# Patient Record
Sex: Female | Born: 1975 | Race: White | Hispanic: No | Marital: Married | Smoking: Former smoker
Health system: Southern US, Community
[De-identification: ages and names within clinical notes are randomized; demographics above are authoritative.]

## PROBLEM LIST (undated history)

## (undated) ENCOUNTER — Inpatient Hospital Stay (HOSPITAL_COMMUNITY): Payer: Self-pay

## (undated) DIAGNOSIS — R87629 Unspecified abnormal cytological findings in specimens from vagina: Secondary | ICD-10-CM

## (undated) DIAGNOSIS — Z973 Presence of spectacles and contact lenses: Secondary | ICD-10-CM

## (undated) DIAGNOSIS — R519 Headache, unspecified: Secondary | ICD-10-CM

## (undated) DIAGNOSIS — D649 Anemia, unspecified: Secondary | ICD-10-CM

## (undated) DIAGNOSIS — O09529 Supervision of elderly multigravida, unspecified trimester: Secondary | ICD-10-CM

## (undated) DIAGNOSIS — R51 Headache: Secondary | ICD-10-CM

## (undated) DIAGNOSIS — Z8742 Personal history of other diseases of the female genital tract: Secondary | ICD-10-CM

## (undated) DIAGNOSIS — Z8619 Personal history of other infectious and parasitic diseases: Secondary | ICD-10-CM

## (undated) HISTORY — DX: Supervision of elderly multigravida, unspecified trimester: O09.529

## (undated) HISTORY — DX: Headache: R51

## (undated) HISTORY — DX: Unspecified abnormal cytological findings in specimens from vagina: R87.629

## (undated) HISTORY — DX: Personal history of other infectious and parasitic diseases: Z86.19

## (undated) HISTORY — DX: Headache, unspecified: R51.9

## (undated) HISTORY — PX: DILATION AND CURETTAGE OF UTERUS: SHX78

---

## 1998-04-22 ENCOUNTER — Emergency Department (HOSPITAL_COMMUNITY): Admission: EM | Admit: 1998-04-22 | Discharge: 1998-04-22 | Payer: Self-pay | Admitting: Emergency Medicine

## 2000-10-06 ENCOUNTER — Encounter: Payer: Self-pay | Admitting: Obstetrics

## 2000-10-06 ENCOUNTER — Inpatient Hospital Stay (HOSPITAL_COMMUNITY): Admission: AD | Admit: 2000-10-06 | Discharge: 2000-10-06 | Payer: Self-pay | Admitting: Obstetrics

## 2000-10-15 ENCOUNTER — Inpatient Hospital Stay (HOSPITAL_COMMUNITY): Admission: AD | Admit: 2000-10-15 | Discharge: 2000-10-21 | Payer: Self-pay | Admitting: *Deleted

## 2000-10-20 ENCOUNTER — Encounter: Payer: Self-pay | Admitting: *Deleted

## 2000-10-20 ENCOUNTER — Encounter: Payer: Self-pay | Admitting: Obstetrics & Gynecology

## 2000-12-10 ENCOUNTER — Encounter (HOSPITAL_COMMUNITY): Admission: RE | Admit: 2000-12-10 | Discharge: 2001-01-09 | Payer: Self-pay | Admitting: *Deleted

## 2001-01-17 ENCOUNTER — Other Ambulatory Visit: Admission: RE | Admit: 2001-01-17 | Discharge: 2001-01-17 | Payer: Self-pay | Admitting: Obstetrics and Gynecology

## 2001-03-26 HISTORY — PX: FOOT SURGERY: SHX648

## 2003-01-29 ENCOUNTER — Other Ambulatory Visit: Admission: RE | Admit: 2003-01-29 | Discharge: 2003-01-29 | Payer: Self-pay | Admitting: Obstetrics and Gynecology

## 2003-02-07 ENCOUNTER — Ambulatory Visit (HOSPITAL_COMMUNITY): Admission: AD | Admit: 2003-02-07 | Discharge: 2003-02-07 | Payer: Self-pay | Admitting: Obstetrics and Gynecology

## 2003-03-15 ENCOUNTER — Emergency Department (HOSPITAL_COMMUNITY): Admission: EM | Admit: 2003-03-15 | Discharge: 2003-03-15 | Payer: Self-pay | Admitting: Emergency Medicine

## 2003-03-29 ENCOUNTER — Emergency Department (HOSPITAL_COMMUNITY): Admission: AD | Admit: 2003-03-29 | Discharge: 2003-03-29 | Payer: Self-pay | Admitting: Family Medicine

## 2004-06-18 ENCOUNTER — Emergency Department (HOSPITAL_COMMUNITY): Admission: EM | Admit: 2004-06-18 | Discharge: 2004-06-18 | Payer: Self-pay | Admitting: Emergency Medicine

## 2006-10-09 ENCOUNTER — Other Ambulatory Visit: Admission: RE | Admit: 2006-10-09 | Discharge: 2006-10-09 | Payer: Self-pay | Admitting: Family Medicine

## 2007-12-26 ENCOUNTER — Ambulatory Visit (HOSPITAL_COMMUNITY): Admission: RE | Admit: 2007-12-26 | Discharge: 2007-12-26 | Payer: Self-pay | Admitting: Obstetrics and Gynecology

## 2008-07-29 ENCOUNTER — Ambulatory Visit: Payer: Self-pay | Admitting: Advanced Practice Midwife

## 2008-07-29 ENCOUNTER — Inpatient Hospital Stay (HOSPITAL_COMMUNITY): Admission: AD | Admit: 2008-07-29 | Discharge: 2008-07-29 | Payer: Self-pay | Admitting: Obstetrics & Gynecology

## 2008-08-13 ENCOUNTER — Inpatient Hospital Stay (HOSPITAL_COMMUNITY): Admission: RE | Admit: 2008-08-13 | Discharge: 2008-08-15 | Payer: Self-pay | Admitting: Obstetrics and Gynecology

## 2009-05-09 ENCOUNTER — Ambulatory Visit (HOSPITAL_COMMUNITY): Payer: Self-pay | Admitting: Psychology

## 2009-06-07 ENCOUNTER — Ambulatory Visit (HOSPITAL_COMMUNITY): Payer: Self-pay | Admitting: Psychology

## 2009-07-05 ENCOUNTER — Ambulatory Visit (HOSPITAL_COMMUNITY): Payer: Self-pay | Admitting: Psychology

## 2010-04-15 ENCOUNTER — Encounter: Payer: Self-pay | Admitting: Internal Medicine

## 2010-07-04 LAB — COMPREHENSIVE METABOLIC PANEL
ALT: 13 U/L (ref 0–35)
BUN: 3 mg/dL — ABNORMAL LOW (ref 6–23)
CO2: 21 mEq/L (ref 19–32)
Calcium: 8.8 mg/dL (ref 8.4–10.5)
Chloride: 109 mEq/L (ref 96–112)
Creatinine, Ser: 0.53 mg/dL (ref 0.4–1.2)
GFR calc Af Amer: >60 mL/min (ref >60)
Potassium: 3.7 mEq/L (ref 3.5–5.1)
Sodium: 137 mEq/L (ref 135–145)
Total Bilirubin: 0.2 mg/dL — ABNORMAL LOW (ref 0.3–1.2)

## 2010-07-04 LAB — CBC
HCT: 23.1 % — ABNORMAL LOW (ref 36.0–46.0)
Hemoglobin: 8.2 g/dL — ABNORMAL LOW (ref 12.0–15.0)
Hemoglobin: 10.6 g/dL — ABNORMAL LOW (ref 12.0–15.0)
MCHC: 35.7 g/dL (ref 30.0–36.0)
MCV: 89.7 fL (ref 78.0–100.0)
Platelets: 109 10*3/uL — ABNORMAL LOW (ref 150–400)
Platelets: 144 10*3/uL — ABNORMAL LOW (ref 150–400)
Platelets: 145 10*3/uL — ABNORMAL LOW (ref 150–400)
RBC: 3.41 MIL/uL — ABNORMAL LOW (ref 3.87–5.11)
RDW: 13.9 % (ref 11.5–15.5)
WBC: 12.8 10*3/uL — ABNORMAL HIGH (ref 4.0–10.5)
WBC: 13.5 10*3/uL — ABNORMAL HIGH (ref 4.0–10.5)

## 2010-07-04 LAB — LACTATE DEHYDROGENASE: LDH: 136 U/L (ref 94–250)

## 2010-12-26 LAB — ABO/RH: ABO/RH(D): AB POS

## 2013-07-08 LAB — OB RESULTS CONSOLE RUBELLA ANTIBODY, IGM: RUBELLA: NON-IMMUNE/NOT IMMUNE

## 2013-07-08 LAB — OB RESULTS CONSOLE RPR: RPR: NONREACTIVE

## 2013-07-08 LAB — OB RESULTS CONSOLE ANTIBODY SCREEN: Antibody Screen: NEGATIVE

## 2013-07-08 LAB — OB RESULTS CONSOLE GC/CHLAMYDIA
CHLAMYDIA, DNA PROBE: NEGATIVE
Gonorrhea: NEGATIVE

## 2013-07-08 LAB — OB RESULTS CONSOLE HEPATITIS B SURFACE ANTIGEN: HEP B S AG: NEGATIVE

## 2013-07-08 LAB — OB RESULTS CONSOLE ABO/RH: RH TYPE: POSITIVE

## 2013-07-08 LAB — OB RESULTS CONSOLE HIV ANTIBODY (ROUTINE TESTING): HIV: NONREACTIVE

## 2013-11-16 ENCOUNTER — Other Ambulatory Visit: Payer: Self-pay | Admitting: Obstetrics and Gynecology

## 2013-11-17 LAB — CYTOLOGY - PAP

## 2014-01-04 ENCOUNTER — Encounter (HOSPITAL_COMMUNITY): Payer: Self-pay | Admitting: *Deleted

## 2014-01-04 ENCOUNTER — Inpatient Hospital Stay (HOSPITAL_COMMUNITY)
Admission: AD | Admit: 2014-01-04 | Discharge: 2014-01-04 | Disposition: A | Discharge status: Home / Self Care | Payer: BC Managed Care – PPO | Source: Ambulatory Visit | Attending: Obstetrics and Gynecology | Admitting: Obstetrics and Gynecology

## 2014-01-04 DIAGNOSIS — Z3A34 34 weeks gestation of pregnancy: Secondary | ICD-10-CM | POA: Diagnosis not present

## 2014-01-04 DIAGNOSIS — S3981XA Other specified injuries of abdomen, initial encounter: Secondary | ICD-10-CM | POA: Diagnosis not present

## 2014-01-04 DIAGNOSIS — W108XXA Fall (on) (from) other stairs and steps, initial encounter: Secondary | ICD-10-CM | POA: Diagnosis not present

## 2014-01-04 DIAGNOSIS — W1839XA Other fall on same level, initial encounter: Secondary | ICD-10-CM

## 2014-01-04 DIAGNOSIS — S80212A Abrasion, left knee, initial encounter: Secondary | ICD-10-CM | POA: Diagnosis not present

## 2014-01-04 DIAGNOSIS — S8980XA Other specified injuries of unspecified lower leg, initial encounter: Secondary | ICD-10-CM | POA: Diagnosis not present

## 2014-01-04 DIAGNOSIS — O9A213 Injury, poisoning and certain other consequences of external causes complicating pregnancy, third trimester: Secondary | ICD-10-CM

## 2014-01-04 DIAGNOSIS — O9989 Other specified diseases and conditions complicating pregnancy, childbirth and the puerperium: Secondary | ICD-10-CM | POA: Diagnosis present

## 2014-01-04 DIAGNOSIS — O26893 Other specified pregnancy related conditions, third trimester: Secondary | ICD-10-CM | POA: Diagnosis not present

## 2014-01-04 DIAGNOSIS — S80211A Abrasion, right knee, initial encounter: Secondary | ICD-10-CM | POA: Diagnosis not present

## 2014-01-04 DIAGNOSIS — Y92481 Parking lot as the place of occurrence of the external cause: Secondary | ICD-10-CM | POA: Insufficient documentation

## 2014-01-04 DIAGNOSIS — Y9389 Activity, other specified: Secondary | ICD-10-CM

## 2014-01-04 NOTE — MAU Provider Note (Signed)
History     CSN: [id number]  Arrival date and time: 01/04/14 1839   None     Chief Complaint  Patient presents with  . Fall   HPI  [name] is a 38 y.o. female [phone] at 61w1dwho presents following a fall that occurred around 1800. She was at work in the parking lot; she was walking and did not noticed a step down. When she fell she landed on her knees; when she realized she fell she was on her abdomen. Her knees took the weight of her fall. +fetal movement. Denies abdominal pain, leaking of fluid, or vaginal bleeding. The patient did not lose consciousness or hit her head; she panicked and that's all she remembers about the fall. She currently denies pain.    RN note: Pt presents with complaints of falling in there parking lot at work. She landed on her knee to break the fall but did hit her abdomen. Denies any abdominal cramping        OB History   Grav Para Term Preterm Abortions TAB SAB Ect Mult Living   5 1 1  0 3  3   1       History reviewed. No pertinent past medical history.  Past Surgical History  Procedure Laterality Date  . Dilation and curettage of uterus      History reviewed. No pertinent family history.  History  Substance Use Topics  . Smoking status: Never Smoker   . Smokeless tobacco: Never Used  . Alcohol Use: No    Allergies: No Known Allergies  Prescriptions prior to admission  Medication Sig Dispense Refill  . cetirizine (ZYRTEC) 10 MG tablet Take 10 mg by mouth daily.      . ferrous sulfate 325 (65 FE) MG tablet Take 325 mg by mouth daily with breakfast.      . Prenatal Vit-Fe Fumarate-FA (PRENATAL MULTIVITAMIN) TABS tablet Take 1 tablet by mouth daily at 12 noon.       No results found for this or any previous visit (from the past 48 hour(s)).   Review of Systems  Constitutional: Negative for fever and chills.  Gastrointestinal: Negative for nausea, vomiting and abdominal pain.  Musculoskeletal: Positive for falls.   Skin:       Scratches on both knees.    Physical Exam   Blood pressure 121/72, pulse 92, temperature 97.8 F (36.6 C), resp. rate 18.  Physical Exam  Constitutional: She appears well-developed and well-nourished. No distress.  HENT:  Head: Normocephalic.  Eyes: Pupils are equal, round, and reactive to light.  Neck: Neck supple.  Cardiovascular: Normal rate and normal heart sounds.   Respiratory: Effort normal and breath sounds normal.  GI: Soft. There is no tenderness.  Musculoskeletal: Normal range of motion.       Right knee: She exhibits normal range of motion, no swelling and no erythema.       Left knee: She exhibits normal range of motion, no swelling and no erythema.  Minor abrasions to bilateral knees   Neurological: She is alert. GCS eye subscore is 4. GCS verbal subscore is 5. GCS motor subscore is 6.  Skin: Skin is warm. She is not diaphoretic.  Psychiatric: Her behavior is normal.   Fetal Tracing: Baseline: 140 Variability: moderate  Accelerations: 15x15 Decelerations: None Toco: None   MAU Course  Procedures None  MDM NST  AB positive blood type  Discussed the patient with Dr. TGaetano Net It is recommeneded that the patient  stay for 4 hours of fetal monitoroing. The patient is present with her husband and is unsure whether or not she can stay for all of the monitoring that is recommended. I discussed with the patient that if she is unable to stay for the monitoring that she will need to sign out of MAU as AMA. Report given to V. Tamala Julian CNM who resumes care of the patient.  Assessment and Plan   Ann Hillock Rasch, NP 01/04/2014 8:07 PM  Care of pt assumed by Manya Silvas, CNM at 8:10 pm. FHR reactive. No contractions. Pt plans to stay for remainder of 4 hours of monitoring recommended by Dr. Gaetano Net.   FHR reactive throughout MAU visit. No UC's. No VB.  ASSESSMENT: 1. Traumatic injury during pregnancy in third trimester    PLAN: D/C  home Abruption precautions. PTL precautions and FKCs Follow-up Information   Follow up with [facility] Marjean Donna, MD. (As scheduled )    Specialty:  Obstetrics and Gynecology   Contact information:   [address] [place] [address] [phone]       Follow up with [facility]. (As needed in emergencies)    Contact information:   [id number] [place] [address] [phone]       Medication List         acetaminophen 325 MG tablet  Commonly known as:  TYLENOL  Take 650 mg by mouth every 6 (six) hours as needed for headache.     cetirizine 10 MG tablet  Commonly known as:  ZYRTEC  Take 10 mg by mouth daily.     ferrous sulfate 325 (65 FE) MG tablet  Take 325 mg by mouth daily.     HYDROcodone-acetaminophen 5-325 MG per tablet  Commonly known as:  NORCO/VICODIN  Take 1 tablet by mouth every 6 (six) hours as needed (for migraines).     prenatal multivitamin Tabs tablet  Take 1 tablet by mouth daily.       [place], CNM 01/04/2014 11:00 PM

## 2014-01-04 NOTE — Discharge Instructions (Signed)
What Do I Need to Know About Injuries During Pregnancy? Injuries can happen during pregnancy. Minor falls and accidents usually do not harm you or your baby. However, any injury should be reported to your doctor. WHAT CAN I DO TO PROTECT MYSELF FROM INJURIES?  Remove rugs and loose objects on the floor.  Wear comfortable shoes that have a good grip. Do not wear high-heeled shoes.  Always wear your seat belt. The lap belt should be below your belly. Always practice safe driving.  Do not ride on a motorcycle.  Do not participate in high-impact activities or sports.  Avoid:  Walking on wet or slippery floors.  Fires.  Starting fires.  Lifting heavy pots of boiling or hot liquids.  Fixing electrical problems.  Only take medicine as told by your doctor.  Know your blood type and the blood type of the baby's father.  Call your local emergency services (911 in the [place] if you are a victim of domestic violence or assault. For help and support, contact the [employer]. WHEN SHOULD I GET HELP RIGHT AWAY?  You fall on your belly or have any high-impact accident or injury.  You have been a victim of domestic violence or any kind of violence.  You have been in a car accident.  You have bleeding from your vagina.  Fluid is leaking from your vagina.  You start to have belly cramping (contractions) or pain.  You feel weak or pass out (faint).  You start to throw up (vomit) after an injury.  You have been burned.  You have a stiff neck or neck pain.  You get a headache or have vision problems after an injury.  You do not feel the baby move or the baby is not moving as much as normal. Document Released: 04/14/2010 Document Revised: 07/27/2013 Document Reviewed: 12/17/2012 [facility] Patient Information 2015 [place], [place]. This information is not intended to replace advice given to you by your health care provider. Make sure you discuss any questions you  have with your health care provider.  Placental Abruption Your placenta is the organ that nourishes your unborn baby (fetus). Your baby gets his or her blood supply and nutrients through your placenta. It is your baby's life support system. It is attached to the inside of your uterus until after your baby is born.  Placental abruption is when the placenta partly or completely separates from the uterus before your baby is born. This is rare, but it can happen any time after 20 weeks of pregnancy. A small separation may not cause problems, but a large separation may be dangerous for you and your baby. CAUSES  Most of the time the cause of a placental abruption is unknown. Though it is rare, a placental abruption can be caused by:   An abdominal injury.   The baby turning from a buttocks-first position (breech presentation) or a sideways position (transverse) to a headfirst position (cephalic).   Delivering the first of multiple babies (twins, triplets, or more).   Sudden loss of amniotic fluid (premature rupture of the membranes).   An abnormally short umbilical cord. RISK FACTORS Some risk factors make a placental abruption more likely, including:  History of placental abruption.  High blood pressure (hypertension).  Smoking.  Alcohol intake.  Blood clotting problems.  Too much amniotic fluid.  Having had multiples (twins or triplets or more).  Seizures and convulsions.  Diabetes mellitus.  Having had more than four children.  Age 37 years  or older.  Illegal drug use.  Injury to your abdomen. SIGNS AND SYMPTOMS  A small placental abruption may not cause symptoms. If you do have symptoms, they may include:  Mild abdominal pain.  Slight vaginal bleeding. Symptoms of severe placental abruption depend on the size of the separation and the stage of pregnancy. Symptoms may include:   Sudden pain in your uterus.  Abdominal pain.  Vaginal bleeding.  Tender  uterus.  Severe abdominal pain with tenderness.  Continual contractions of your uterus.  Back pain.  Weakness, light-headedness. DIAGNOSIS  Placental abruption is suspected when a pregnant woman develops sudden pain in her uterus. The health care provider will check whether the uterus is very tender, hard, and enlarging and whether the baby has an abnormal heart rate or rhythm. Ultrasonography (commonly called an ultrasound) will be done. Blood work will also be done to make sure that there are enough healthy red blood cells and that there are no clotting problems or signs of too much blood loss. TREATMENT  Placental abruption is usually an emergency. It requires treatment right away. Your treatment will depend on:   The amount of bleeding.  Whether you or you baby are in distress.  The stage of your pregnancy.  The maturity of the baby. Treatment for partial separation of the placenta is bed rest and close observation. You also may need a blood transfusion or to receive fluids through an IV tube. Treatment for complete placental separation is delivery of your baby. You may have a cesarean delivery if your baby is in distress. HOME CARE INSTRUCTIONS   Only take medicines as directed by your health care provider.  Arrange for help at home before and after you deliver the baby, especially if you had a cesarean delivery or lost a lot of blood.  Get plenty of rest and sleep.  Do not have sexual intercourse until your health care provider says it is okay.  Do not use tampons or douche unless your health care provider says it is okay. SEEK MEDICAL CARE IF:  You have light vaginal bleeding or spotting.  You have any type of trauma, such as a fall or jolt during an accident.  You are having trouble avoiding drugs, alcohol, or smoking. SEEK IMMEDIATE MEDICAL CARE IF:  You have vaginal bleeding.  You have abdominal pain.  You have continuous uterine contractions.  You have a  hard, tender uterus.  You do not feel the baby move, or the baby moves very little. MAKE SURE YOU:  Understand these instructions.  Will watch your condition.  Will get help right away if you are not doing well or get worse. Document Released: 03/12/2005 Document Revised: 03/17/2013 Document Reviewed: 01/02/2013 [facility] Patient Information 2015 [place], [place]. This information is not intended to replace advice given to you by your health care provider. Make sure you discuss any questions you have with your health care provider.  Fetal Movement Counts Patient Name: __________________________________________________ Patient Due Date: ____________________ Performing a fetal movement count is highly recommended in high-risk pregnancies, but it is good for every pregnant woman to do. Your health care provider may ask you to start counting fetal movements at 28 weeks of the pregnancy. Fetal movements often increase:  After eating a full meal.  After physical activity.  After eating or drinking something sweet or cold.  At rest. Pay attention to when you feel the baby is most active. This will help you notice a pattern of your baby's sleep and  wake cycles and what factors contribute to an increase in fetal movement. It is important to perform a fetal movement count at the same time each day when your baby is normally most active.  HOW TO COUNT FETAL MOVEMENTS 1. Find a quiet and comfortable area to sit or lie down on your left side. Lying on your left side provides the best blood and oxygen circulation to your baby. 2. Write down the day and time on a sheet of paper or in a journal. 3. Start counting kicks, flutters, swishes, rolls, or jabs in a 2-hour period. You should feel at least 10 movements within 2 hours. 4. If you do not feel 10 movements in 2 hours, wait 2-3 hours and count again. Look for a change in the pattern or not enough counts in 2 hours. SEEK MEDICAL CARE IF:  You feel  less than 10 counts in 2 hours, tried twice.  There is no movement in over an hour.  The pattern is changing or taking longer each day to reach 10 counts in 2 hours.  You feel the baby is not moving as he or she usually does. Date: ____________ Movements: ____________ Start time: ____________ Ann Sims time: ____________  Date: ____________ Movements: ____________ Start time: ____________ Ann Sims time: ____________ Date: ____________ Movements: ____________ Start time: ____________ Ann Sims time: ____________ Date: ____________ Movements: ____________ Start time: ____________ Ann Sims time: ____________ Date: ____________ Movements: ____________ Start time: ____________ Ann Sims time: ____________ Date: ____________ Movements: ____________ Start time: ____________ Ann Sims time: ____________ Date: ____________ Movements: ____________ Start time: ____________ Ann Sims time: ____________ Date: ____________ Movements: ____________ Start time: ____________ Ann Sims time: ____________  Date: ____________ Movements: ____________ Start time: ____________ Ann Sims time: ____________ Date: ____________ Movements: ____________ Start time: ____________ Ann Sims time: ____________ Date: ____________ Movements: ____________ Start time: ____________ Ann Sims time: ____________ Date: ____________ Movements: ____________ Start time: ____________ Ann Sims time: ____________ Date: ____________ Movements: ____________ Start time: ____________ Ann Sims time: ____________ Date: ____________ Movements: ____________ Start time: ____________ Ann Sims time: ____________ Date: ____________ Movements: ____________ Start time: ____________ Ann Sims time: ____________  Date: ____________ Movements: ____________ Start time: ____________ Ann Sims time: ____________ Date: ____________ Movements: ____________ Start time: ____________ Ann Sims time: ____________ Date: ____________ Movements: ____________ Start time: ____________ Ann Sims time:  ____________ Date: ____________ Movements: ____________ Start time: ____________ Ann Sims time: ____________ Date: ____________ Movements: ____________ Start time: ____________ Ann Sims time: ____________ Date: ____________ Movements: ____________ Start time: ____________ Ann Sims time: ____________ Date: ____________ Movements: ____________ Start time: ____________ Ann Sims time: ____________  Date: ____________ Movements: ____________ Start time: ____________ Ann Sims time: ____________ Date: ____________ Movements: ____________ Start time: ____________ Ann Sims time: ____________ Date: ____________ Movements: ____________ Start time: ____________ Ann Sims time: ____________ Date: ____________ Movements: ____________ Start time: ____________ Ann Sims time: ____________ Date: ____________ Movements: ____________ Start time: ____________ Ann Sims time: ____________ Date: ____________ Movements: ____________ Start time: ____________ Ann Sims time: ____________ Date: ____________ Movements: ____________ Start time: ____________ Ann Sims time: ____________  Date: ____________ Movements: ____________ Start time: ____________ Ann Sims time: ____________ Date: ____________ Movements: ____________ Start time: ____________ Ann Sims time: ____________ Date: ____________ Movements: ____________ Start time: ____________ Ann Sims time: ____________ Date: ____________ Movements: ____________ Start time: ____________ Ann Sims time: ____________ Date: ____________ Movements: ____________ Start time: ____________ Ann Sims time: ____________ Date: ____________ Movements: ____________ Start time: ____________ Ann Sims time: ____________ Date: ____________ Movements: ____________ Start time: ____________ Ann Sims time: ____________  Date: ____________ Movements: ____________ Start time: ____________ Ann Sims time: ____________ Date: ____________ Movements: ____________ Start time: ____________ Ann Sims time: ____________ Date: ____________ Movements:  ____________ Start time: ____________ Ann Sims time: ____________ Date: ____________ Movements: ____________  Start time: ____________ Ann Sims time: ____________ Date: ____________ Movements: ____________ Start time: ____________ Ann Sims time: ____________ Date: ____________ Movements: ____________ Start time: ____________ Ann Sims time: ____________ Date: ____________ Movements: ____________ Start time: ____________ Ann Sims time: ____________  Date: ____________ Movements: ____________ Start time: ____________ Ann Sims time: ____________ Date: ____________ Movements: ____________ Start time: ____________ Ann Sims time: ____________ Date: ____________ Movements: ____________ Start time: ____________ Ann Sims time: ____________ Date: ____________ Movements: ____________ Start time: ____________ Ann Sims time: ____________ Date: ____________ Movements: ____________ Start time: ____________ Ann Sims time: ____________ Date: ____________ Movements: ____________ Start time: ____________ Ann Sims time: ____________ Date: ____________ Movements: ____________ Start time: ____________ Ann Sims time: ____________  Date: ____________ Movements: ____________ Start time: ____________ Ann Sims time: ____________ Date: ____________ Movements: ____________ Start time: ____________ Ann Sims time: ____________ Date: ____________ Movements: ____________ Start time: ____________ Ann Sims time: ____________ Date: ____________ Movements: ____________ Start time: ____________ Ann Sims time: ____________ Date: ____________ Movements: ____________ Start time: ____________ Ann Sims time: ____________ Date: ____________ Movements: ____________ Start time: ____________ Ann Sims time: ____________ Document Released: 04/11/2006 Document Revised: 07/27/2013 Document Reviewed: 01/07/2012 [employer] Patient Information 2015 [place] [place]. This information is not intended to replace advice given to you by your health care provider. Make sure you discuss any  questions you have with your health care provider.

## 2014-01-04 NOTE — MAU Note (Signed)
Pt presents with complaints of falling in there parking lot at work. She landed on her knee to break the fall but did hit her abdomen. Denies any abdominal cramping

## 2014-01-25 ENCOUNTER — Encounter (HOSPITAL_COMMUNITY): Payer: Self-pay | Admitting: *Deleted

## 2014-02-10 ENCOUNTER — Encounter (HOSPITAL_COMMUNITY): Payer: Self-pay | Admitting: *Deleted

## 2014-02-10 ENCOUNTER — Telehealth (HOSPITAL_COMMUNITY): Payer: Self-pay | Admitting: *Deleted

## 2014-02-10 LAB — OB RESULTS CONSOLE GBS: GBS: POSITIVE

## 2014-02-10 NOTE — Telephone Encounter (Signed)
Preadmission screen  

## 2014-02-12 ENCOUNTER — Encounter (HOSPITAL_COMMUNITY): Payer: Self-pay

## 2014-02-12 ENCOUNTER — Inpatient Hospital Stay (HOSPITAL_COMMUNITY): Payer: BC Managed Care – PPO | Admitting: Anesthesiology

## 2014-02-12 ENCOUNTER — Inpatient Hospital Stay (HOSPITAL_COMMUNITY)
Admission: RE | Admit: 2014-02-12 | Discharge: 2014-02-13 | DRG: 775 | Disposition: A | Discharge status: Home / Self Care | Payer: BC Managed Care – PPO | Source: Ambulatory Visit | Attending: Obstetrics and Gynecology | Admitting: Obstetrics and Gynecology

## 2014-02-12 DIAGNOSIS — Z3A39 39 weeks gestation of pregnancy: Secondary | ICD-10-CM | POA: Diagnosis present

## 2014-02-12 DIAGNOSIS — O09523 Supervision of elderly multigravida, third trimester: Secondary | ICD-10-CM

## 2014-02-12 DIAGNOSIS — Z3490 Encounter for supervision of normal pregnancy, unspecified, unspecified trimester: Secondary | ICD-10-CM

## 2014-02-12 DIAGNOSIS — O99824 Streptococcus B carrier state complicating childbirth: Secondary | ICD-10-CM | POA: Diagnosis present

## 2014-02-12 DIAGNOSIS — Z8249 Family history of ischemic heart disease and other diseases of the circulatory system: Secondary | ICD-10-CM | POA: Diagnosis not present

## 2014-02-12 DIAGNOSIS — O9989 Other specified diseases and conditions complicating pregnancy, childbirth and the puerperium: Secondary | ICD-10-CM | POA: Diagnosis present

## 2014-02-12 HISTORY — DX: Anemia, unspecified: D64.9

## 2014-02-12 LAB — CBC
HCT: 30.5 % — ABNORMAL LOW (ref 36.0–46.0)
HEMOGLOBIN: 10.3 g/dL — AB (ref 12.0–15.0)
MCH: 29.3 pg (ref 26.0–34.0)
MCHC: 33.8 g/dL (ref 30.0–36.0)
MCV: 86.9 fL (ref 78.0–100.0)
Platelets: 129 10*3/uL — ABNORMAL LOW (ref 150–400)
RBC: 3.51 MIL/uL — ABNORMAL LOW (ref 3.87–5.11)
RDW: 15.3 % (ref 11.5–15.5)
WBC: 10.9 10*3/uL — AB (ref 4.0–10.5)

## 2014-02-12 LAB — TYPE AND SCREEN
ABO/RH(D): AB POS
Antibody Screen: NEGATIVE

## 2014-02-12 LAB — RPR

## 2014-02-12 MED ORDER — TERBUTALINE SULFATE 1 MG/ML IJ SOLN
0.2500 mg | Freq: Once | INTRAMUSCULAR | Status: DC | PRN
Start: 2014-02-12 — End: 2014-02-12

## 2014-02-12 MED ORDER — IBUPROFEN 600 MG PO TABS
600.0000 mg | ORAL_TABLET | Freq: Four times a day (QID) | ORAL | Status: DC
  Administered 2014-02-12 – 2014-02-13 (×3): 600 mg via ORAL
  Filled 2014-02-12 (×3): qty 1

## 2014-02-12 MED ORDER — PHENYLEPHRINE 40 MCG/ML (10ML) SYRINGE FOR IV PUSH (FOR BLOOD PRESSURE SUPPORT)
PREFILLED_SYRINGE | INTRAVENOUS | Status: AC
  Filled 2014-02-12: qty 10

## 2014-02-12 MED ORDER — EPHEDRINE 5 MG/ML INJ
10.0000 mg | INTRAVENOUS | Status: DC | PRN
  Filled 2014-02-12: qty 2

## 2014-02-12 MED ORDER — OXYTOCIN 40 UNITS IN LACTATED RINGERS INFUSION - SIMPLE MED
1.0000 m[IU]/min | INTRAVENOUS | Status: DC
  Administered 2014-02-12: 2 m[IU]/min via INTRAVENOUS
  Filled 2014-02-12: qty 1000

## 2014-02-12 MED ORDER — FENTANYL 2.5 MCG/ML BUPIVACAINE 1/10 % EPIDURAL INFUSION (WH - ANES)
INTRAMUSCULAR | Status: DC | PRN
  Administered 2014-02-12: 14 mL/h via EPIDURAL

## 2014-02-12 MED ORDER — LIDOCAINE HCL (PF) 1 % IJ SOLN
30.0000 mL | INTRAMUSCULAR | Status: DC | PRN
  Filled 2014-02-12: qty 30

## 2014-02-12 MED ORDER — TETANUS-DIPHTH-ACELL PERTUSSIS 5-2.5-18.5 LF-MCG/0.5 IM SUSP: 0.5000 mL | Freq: Once | INTRAMUSCULAR | Status: DC

## 2014-02-12 MED ORDER — DIPHENHYDRAMINE HCL 50 MG/ML IJ SOLN: 12.5000 mg | INTRAMUSCULAR | Status: DC | PRN

## 2014-02-12 MED ORDER — OXYTOCIN 40 UNITS IN LACTATED RINGERS INFUSION - SIMPLE MED
62.5000 mL/h | INTRAVENOUS | Status: DC
  Administered 2014-02-12: 62.5 mL/h via INTRAVENOUS

## 2014-02-12 MED ORDER — OXYCODONE-ACETAMINOPHEN 5-325 MG PO TABS
1.0000 | ORAL_TABLET | ORAL | Status: DC | PRN
  Administered 2014-02-13: 1 via ORAL
  Filled 2014-02-12: qty 1

## 2014-02-12 MED ORDER — DIPHENHYDRAMINE HCL 25 MG PO CAPS: 25.0000 mg | ORAL_CAPSULE | Freq: Four times a day (QID) | ORAL | Status: DC | PRN

## 2014-02-12 MED ORDER — FLEET ENEMA 7-19 GM/118ML RE ENEM: 1.0000 | ENEMA | RECTAL | Status: DC | PRN

## 2014-02-12 MED ORDER — WITCH HAZEL-GLYCERIN EX PADS: 1.0000 "application " | MEDICATED_PAD | CUTANEOUS | Status: DC | PRN

## 2014-02-12 MED ORDER — PENICILLIN G POTASSIUM 5000000 UNITS IJ SOLR
5.0000 10*6.[IU] | Freq: Once | INTRAVENOUS | Status: AC
  Administered 2014-02-12: 5 10*6.[IU] via INTRAVENOUS
  Filled 2014-02-12: qty 5

## 2014-02-12 MED ORDER — PENICILLIN G POTASSIUM 5000000 UNITS IJ SOLR
2.5000 10*6.[IU] | INTRAVENOUS | Status: DC
  Administered 2014-02-12: 2.5 10*6.[IU] via INTRAVENOUS
  Filled 2014-02-12 (×5): qty 2.5

## 2014-02-12 MED ORDER — ONDANSETRON HCL 4 MG PO TABS
4.0000 mg | ORAL_TABLET | ORAL | Status: DC | PRN
Start: 2014-02-12 — End: 2014-02-13

## 2014-02-12 MED ORDER — ONDANSETRON HCL 4 MG/2ML IJ SOLN: 4.0000 mg | INTRAMUSCULAR | Status: DC | PRN

## 2014-02-12 MED ORDER — LANOLIN HYDROUS EX OINT: TOPICAL_OINTMENT | CUTANEOUS | Status: DC | PRN

## 2014-02-12 MED ORDER — PHENYLEPHRINE 40 MCG/ML (10ML) SYRINGE FOR IV PUSH (FOR BLOOD PRESSURE SUPPORT)
80.0000 ug | PREFILLED_SYRINGE | INTRAVENOUS | Status: DC | PRN
  Filled 2014-02-12: qty 2

## 2014-02-12 MED ORDER — FENTANYL 2.5 MCG/ML BUPIVACAINE 1/10 % EPIDURAL INFUSION (WH - ANES)
INTRAMUSCULAR | Status: AC
  Filled 2014-02-12: qty 125

## 2014-02-12 MED ORDER — OXYTOCIN BOLUS FROM INFUSION: 500.0000 mL | INTRAVENOUS | Status: DC

## 2014-02-12 MED ORDER — MEDROXYPROGESTERONE ACETATE 150 MG/ML IM SUSP: 150.0000 mg | INTRAMUSCULAR | Status: DC | PRN

## 2014-02-12 MED ORDER — LACTATED RINGERS IV SOLN: 500.0000 mL | INTRAVENOUS | Status: DC | PRN

## 2014-02-12 MED ORDER — LACTATED RINGERS IV SOLN
INTRAVENOUS | Status: DC
Start: 2014-02-12 — End: 2014-02-12
  Administered 2014-02-12: 07:00:00 via INTRAVENOUS
  Administered 2014-02-12: 125 mL/h via INTRAVENOUS

## 2014-02-12 MED ORDER — PHENYLEPHRINE 40 MCG/ML (10ML) SYRINGE FOR IV PUSH (FOR BLOOD PRESSURE SUPPORT)
80.0000 ug | PREFILLED_SYRINGE | INTRAVENOUS | Status: DC | PRN
Start: 2014-02-12 — End: 2014-02-12
  Filled 2014-02-12: qty 2

## 2014-02-12 MED ORDER — OXYCODONE-ACETAMINOPHEN 5-325 MG PO TABS: 2.0000 | ORAL_TABLET | ORAL | Status: DC | PRN

## 2014-02-12 MED ORDER — PRENATAL MULTIVITAMIN CH
1.0000 | ORAL_TABLET | Freq: Every day | ORAL | Status: DC
  Administered 2014-02-13: 1 via ORAL
  Filled 2014-02-12: qty 1

## 2014-02-12 MED ORDER — BENZOCAINE-MENTHOL 20-0.5 % EX AERO
1.0000 "application " | INHALATION_SPRAY | CUTANEOUS | Status: DC | PRN
  Filled 2014-02-12: qty 56

## 2014-02-12 MED ORDER — CITRIC ACID-SODIUM CITRATE 334-500 MG/5ML PO SOLN: 30.0000 mL | ORAL | Status: DC | PRN

## 2014-02-12 MED ORDER — ACETAMINOPHEN 325 MG PO TABS: 650.0000 mg | ORAL_TABLET | ORAL | Status: DC | PRN

## 2014-02-12 MED ORDER — ONDANSETRON HCL 4 MG/2ML IJ SOLN: 4.0000 mg | Freq: Four times a day (QID) | INTRAMUSCULAR | Status: DC | PRN

## 2014-02-12 MED ORDER — LIDOCAINE HCL (PF) 1 % IJ SOLN
INTRAMUSCULAR | Status: DC | PRN
  Administered 2014-02-12 (×2): 8 mL

## 2014-02-12 MED ORDER — OXYCODONE-ACETAMINOPHEN 5-325 MG PO TABS: 1.0000 | ORAL_TABLET | ORAL | Status: DC | PRN

## 2014-02-12 MED ORDER — LACTATED RINGERS IV SOLN: 500.0000 mL | Freq: Once | INTRAVENOUS | Status: DC

## 2014-02-12 MED ORDER — MEASLES, MUMPS & RUBELLA VAC ~~LOC~~ INJ: 0.5000 mL | INJECTION | Freq: Once | SUBCUTANEOUS | Status: DC

## 2014-02-12 MED ORDER — SENNOSIDES-DOCUSATE SODIUM 8.6-50 MG PO TABS
2.0000 | ORAL_TABLET | ORAL | Status: DC
  Administered 2014-02-13: 2 via ORAL
  Filled 2014-02-12: qty 2

## 2014-02-12 MED ORDER — SIMETHICONE 80 MG PO CHEW: 80.0000 mg | CHEWABLE_TABLET | ORAL | Status: DC | PRN

## 2014-02-12 MED ORDER — FENTANYL 2.5 MCG/ML BUPIVACAINE 1/10 % EPIDURAL INFUSION (WH - ANES): 14.0000 mL/h | INTRAMUSCULAR | Status: DC | PRN

## 2014-02-12 MED ORDER — DIBUCAINE 1 % RE OINT: 1.0000 "application " | TOPICAL_OINTMENT | RECTAL | Status: DC | PRN

## 2014-02-12 NOTE — Plan of Care (Signed)
Problem: Phase I Progression Outcomes Goal: Pain controlled with appropriate interventions Outcome: Completed/Met Date Met:  02/12/14     

## 2014-02-12 NOTE — Anesthesia Procedure Notes (Signed)
Epidural Patient location during procedure: OB Start time: 02/12/2014 9:02 AM End time: 02/12/2014 9:06 AM  Staffing Anesthesiologist: Lyn Hollingshead  Preanesthetic Checklist Completed: patient identified, surgical consent, pre-op evaluation, timeout performed, IV checked, risks and benefits discussed and monitors and equipment checked  Epidural Patient position: sitting Prep: site prepped and draped and DuraPrep Patient monitoring: continuous pulse ox and blood pressure Approach: midline Location: L3-L4 Injection technique: LOR air  Needle:  Needle type: Tuohy  Needle gauge: 17 G Needle length: 9 cm and 9 Needle insertion depth: 5 cm cm Catheter type: closed end flexible Catheter size: 19 Gauge Catheter at skin depth: 10 cm Test dose: negative and Other  Assessment Sensory level: T9 Events: blood not aspirated, injection not painful, no injection resistance, negative IV test and no paresthesia  Additional Notes Reason for block:procedure for pain

## 2014-02-12 NOTE — Progress Notes (Signed)
Pt requests PPBTL R/b/a discussed, questions answered, informed consent Scheduled for 0730 11/21

## 2014-02-12 NOTE — Plan of Care (Signed)
Problem: Phase I Progression Outcomes Goal: IS, TCDB as ordered Outcome: Not Applicable Date Met:  52/84/13

## 2014-02-12 NOTE — Progress Notes (Signed)
Pt without c/o FHT reassuring Toco irregular Cvx 2/60/-2 AROM - light mec  A/P:  Pitocin PCN Epidural prn

## 2014-02-12 NOTE — H&P (Signed)
Ann Sims is a 38 y.o. female G57P1 @ 39+4 wks presenting for IOL.  No lof or vb.   History OB History    Gravida Para Term Preterm AB TAB SAB Ectopic Multiple Living   5 1 1  0 3  3   1      Past Medical History  Diagnosis Date  . Vaginal Pap smear, abnormal   . Headache   . AMA (advanced maternal age) multigravida 67+   . Anemia    Past Surgical History  Procedure Laterality Date  . Dilation and curettage of uterus    . Foot surgery      bilateral to remove extra bones in feet   Family History: family history includes Depression in her father and paternal aunt; Heart attack in her father; Hypertension in her father. Social History:  reports that she has never smoked. She has never used smokeless tobacco. She reports that she does not drink alcohol or use illicit drugs.   Prenatal Transfer Tool  Maternal Diabetes: No Genetic Screening: Normal Maternal Ultrasounds/Referrals: Normal Fetal Ultrasounds or other Referrals:  None Maternal Substance Abuse:  No Significant Maternal Medications:  None Significant Maternal Lab Results:  None Other Comments:  None  ROS  Dilation: 2.5 Exam by:: m wilkins rnc Blood pressure 124/62, pulse 86, temperature 98.2 F (36.8 C), temperature source Oral, resp. rate 18, height 5' 5"  (1.651 m), weight 82.101 kg (181 lb). Exam Physical Exam  Gen - NAD Abd - gravid, NT Ext - NT Cvx 2cm Prenatal labs: ABO, Rh: AB/Positive/-- (04/15 0000) Antibody: Negative (04/15 0000) Rubella: Nonimmune (04/15 0000) RPR: Nonreactive (04/15 0000)  HBsAg: Negative (04/15 0000)  HIV: Non-reactive (04/15 0000)  GBS: Positive (11/18 0000)   Assessment/Plan: Admit PCN for GBS prophylaxis Pitocin indxn   Ann Sims 02/12/2014, 7:55 AM

## 2014-02-12 NOTE — Anesthesia Preprocedure Evaluation (Addendum)
Anesthesia Evaluation  Patient identified by MRN, date of birth, ID band Patient awake    Reviewed: Allergy & Precautions, H&P , NPO status , Patient's Chart, lab work & pertinent test results  Airway Mallampati: I  TM Distance: >3 FB Neck ROM: full    Dental no notable dental hx.    Pulmonary neg pulmonary ROS,    Pulmonary exam normal       Cardiovascular negative cardio ROS      Neuro/Psych negative psych ROS   GI/Hepatic negative GI ROS, Neg liver ROS,   Endo/Other  negative endocrine ROS  Renal/GU negative Renal ROS     Musculoskeletal   Abdominal Normal abdominal exam  (+)   Peds  Hematology   Anesthesia Other Findings   Reproductive/Obstetrics (+) Pregnancy                             Anesthesia Physical Anesthesia Plan  ASA: II  Anesthesia Plan: Epidural   Post-op Pain Management:    Induction:   Airway Management Planned:   Additional Equipment:   Intra-op Plan:   Post-operative Plan:   Informed Consent: I have reviewed the patients History and Physical, chart, labs and discussed the procedure including the risks, benefits and alternatives for the proposed anesthesia with the patient or authorized representative who has indicated his/her understanding and acceptance.   Dental advisory given  Plan Discussed with: CRNA  Anesthesia Plan Comments:        Anesthesia Quick Evaluation

## 2014-02-12 NOTE — Progress Notes (Signed)
SVD of vigorous female infant w/ apgars of 9,9.  Vertex delivered LOA, right anterior shoulder dystocia (<20sec) Resolved easily by delivery of left posterior shoulder Placenta delivered spontaneous w/ 3VC.   2nd degree lac repaired w/ 3-0 vicryl rapide.  Fundus firm.  EBL 450cc .

## 2014-02-13 ENCOUNTER — Encounter (HOSPITAL_COMMUNITY)
Admission: RE | Disposition: A | Discharge status: Home / Self Care | Payer: Self-pay | Source: Ambulatory Visit | Attending: Obstetrics and Gynecology

## 2014-02-13 ENCOUNTER — Encounter (HOSPITAL_COMMUNITY): Payer: Self-pay

## 2014-02-13 LAB — CBC
HCT: 25.1 % — ABNORMAL LOW (ref 36.0–46.0)
HEMATOCRIT: 27.3 % — AB (ref 36.0–46.0)
Hemoglobin: 8.7 g/dL — ABNORMAL LOW (ref 12.0–15.0)
Hemoglobin: 9.3 g/dL — ABNORMAL LOW (ref 12.0–15.0)
MCH: 30.4 pg (ref 26.0–34.0)
MCH: 30.1 pg (ref 26.0–34.0)
MCHC: 34.7 g/dL (ref 30.0–36.0)
MCHC: 34.1 g/dL (ref 30.0–36.0)
MCV: 87.8 fL (ref 78.0–100.0)
MCV: 88.3 fL (ref 78.0–100.0)
Platelets: 93 10*3/uL — ABNORMAL LOW (ref 150–400)
Platelets: 104 10*3/uL — ABNORMAL LOW (ref 150–400)
RBC: 2.86 MIL/uL — ABNORMAL LOW (ref 3.87–5.11)
RBC: 3.09 MIL/uL — AB (ref 3.87–5.11)
RDW: 15.3 % (ref 11.5–15.5)
RDW: 15.5 % (ref 11.5–15.5)
WBC: 10.1 10*3/uL (ref 4.0–10.5)
WBC: 11.6 10*3/uL — ABNORMAL HIGH (ref 4.0–10.5)

## 2014-02-13 LAB — MRSA PCR SCREENING: MRSA by PCR: NEGATIVE

## 2014-02-13 SURGERY — LIGATION, FALLOPIAN TUBE, POSTPARTUM
Anesthesia: Epidural | Site: Abdomen | Laterality: Bilateral

## 2014-02-13 MED ORDER — METOCLOPRAMIDE HCL 10 MG PO TABS
10.0000 mg | ORAL_TABLET | Freq: Once | ORAL | Status: AC
  Administered 2014-02-13: 10 mg via ORAL
  Filled 2014-02-13 (×2): qty 1

## 2014-02-13 MED ORDER — LACTATED RINGERS IV SOLN
INTRAVENOUS | Status: DC
  Administered 2014-02-13: 05:00:00 via INTRAVENOUS

## 2014-02-13 MED ORDER — OXYTOCIN 10 UNIT/ML IJ SOLN
INTRAMUSCULAR | Status: AC
  Filled 2014-02-13: qty 4

## 2014-02-13 MED ORDER — BUPIVACAINE HCL (PF) 0.25 % IJ SOLN
INTRAMUSCULAR | Status: AC
  Filled 2014-02-13: qty 30

## 2014-02-13 MED ORDER — FAMOTIDINE 20 MG PO TABS
40.0000 mg | ORAL_TABLET | Freq: Once | ORAL | Status: AC
  Administered 2014-02-13: 40 mg via ORAL
  Filled 2014-02-13 (×2): qty 2

## 2014-02-13 MED ORDER — LIDOCAINE-EPINEPHRINE (PF) 2 %-1:200000 IJ SOLN
INTRAMUSCULAR | Status: AC
Start: 2014-02-13 — End: 2014-02-13
  Filled 2014-02-13: qty 20

## 2014-02-13 MED ORDER — ONDANSETRON HCL 4 MG/2ML IJ SOLN
INTRAMUSCULAR | Status: AC
  Filled 2014-02-13: qty 2

## 2014-02-13 MED ORDER — OXYCODONE-ACETAMINOPHEN 5-325 MG PO TABS: 1.0000 | ORAL_TABLET | ORAL | Status: DC | PRN

## 2014-02-13 MED ORDER — SODIUM BICARBONATE 8.4 % IV SOLN
INTRAVENOUS | Status: AC
  Filled 2014-02-13: qty 50

## 2014-02-13 SURGICAL SUPPLY — 25 items
CHLORAPREP W/TINT 26ML (MISCELLANEOUS) IMPLANT
CLOTH BEACON ORANGE TIMEOUT ST (SAFETY) IMPLANT
CONTAINER PREFILL 10% NBF 15ML (MISCELLANEOUS) IMPLANT
DRSG OPSITE POSTOP 3X4 (GAUZE/BANDAGES/DRESSINGS) IMPLANT
ELECT REM PT RETURN 9FT ADLT (ELECTROSURGICAL)
ELECTRODE REM PT RTRN 9FT ADLT (ELECTROSURGICAL) IMPLANT
GLOVE BIO SURGEON STRL SZ 6.5 (GLOVE) IMPLANT
GLOVE BIO SURGEONS STRL SZ 6.5 (GLOVE)
GLOVE BIOGEL PI IND STRL 7.0 (GLOVE) IMPLANT
GLOVE BIOGEL PI INDICATOR 7.0 (GLOVE)
GOWN STRL REUS W/TWL LRG LVL3 (GOWN DISPOSABLE) IMPLANT
NEEDLE HYPO 25X1 1.5 SAFETY (NEEDLE) IMPLANT
NS IRRIG 1000ML POUR BTL (IV SOLUTION) IMPLANT
PACK ABDOMINAL MINOR (CUSTOM PROCEDURE TRAY) IMPLANT
PENCIL BUTTON HOLSTER BLD 10FT (ELECTRODE) IMPLANT
SPONGE LAP 4X18 X RAY DECT (DISPOSABLE) IMPLANT
SUT GUT PLAIN 0 CT-3 TAN 27 (SUTURE) IMPLANT
SUT PLAIN 0 NONE (SUTURE) IMPLANT
SUT VIC AB 0 CT2 27 (SUTURE) IMPLANT
SUT VIC AB 3-0 PS2 18 (SUTURE)
SUT VIC AB 3-0 PS2 18XBRD (SUTURE) IMPLANT
SYR CONTROL 10ML LL (SYRINGE) IMPLANT
TOWEL OR 17X24 6PK STRL BLUE (TOWEL DISPOSABLE) IMPLANT
TRAY FOLEY CATH 14FR (SET/KITS/TRAYS/PACK) IMPLANT
WATER STERILE IRR 1000ML POUR (IV SOLUTION) IMPLANT

## 2014-02-13 NOTE — Plan of Care (Signed)
Problem: Phase I Progression Outcomes Goal: Voiding adequately Outcome: Completed/Met Date Met:  02/13/14 Goal: VS, stable, temp < 100.4 degrees F Outcome: Completed/Met Date Met:  02/13/14 Goal: Initial discharge plan identified Outcome: Completed/Met Date Met:  02/13/14 Goal: Other Phase I Outcomes/Goals Outcome: Not Applicable Date Met:  40/99/27

## 2014-02-13 NOTE — Progress Notes (Signed)
Post Partum Day 1 Subjective: no complaints.  Pt desires PPBTL and early discharge.  Objective: Blood pressure 94/56, pulse 82, temperature 97.8 F (36.6 C), temperature source Oral, resp. rate 18, height 5' 5"  (1.651 m), weight 82.101 kg (181 lb), SpO2 100 %, unknown if currently breastfeeding.  Physical Exam:  General: alert and cooperative Lochia: appropriate Uterine Fundus: firm Incision: n/a DVT Evaluation: No evidence of DVT seen on physical exam.   Recent Labs  02/12/14 0645 02/13/14 0617  HGB 10.3* 8.7*  HCT 30.5* 25.1*    Assessment/Plan: Breastfeeding  Unable to do PPBTL today b/c of plt 93K Pt desires d/c home Will check cbc at noon and consider early d/c   LOS: 1 day   Ann Sims 02/13/2014, 8:11 AM

## 2014-02-13 NOTE — Plan of Care (Signed)
Problem: Consults Goal: Postpartum Patient Education (See Patient Education module for education specifics.)  Outcome: Progressing  Problem: Phase II Progression Outcomes Goal: Pain controlled on oral analgesia Outcome: Completed/Met Date Met:  02/13/14 Goal: Progress activity as tolerated unless otherwise ordered Outcome: Completed/Met Date Met:  02/13/14 Goal: Afebrile, VS remain stable Outcome: Completed/Met Date Met:  02/13/14 Goal: Rh isoimmunization per orders Outcome: Not Applicable Date Met:  68/40/33 Goal: Tolerating diet Outcome: Completed/Met Date Met:  02/13/14 Goal: Other Phase II Outcomes/Goals Outcome: Completed/Met Date Met:  02/13/14  Problem: Discharge Progression Outcomes Goal: Barriers To Progression Addressed/Resolved Outcome: Completed/Met Date Met:  02/13/14 Goal: Activity appropriate for discharge plan Outcome: Completed/Met Date Met:  02/13/14 Goal: Tolerating diet Outcome: Completed/Met Date Met:  53/31/74 Goal: Complications resolved/controlled Outcome: Not Applicable Date Met:  09/92/78 Goal: Pain controlled with appropriate interventions Outcome: Completed/Met Date Met:  02/13/14 Goal: Afebrile, VS remain stable at discharge Outcome: Completed/Met Date Met:  02/13/14 Goal: Discharge plan in place and appropriate Outcome: Not Applicable Date Met:  00/44/71 Goal: Other Discharge Outcomes/Goals Outcome: Completed/Met Date Met:  02/13/14

## 2014-02-13 NOTE — Anesthesia Postprocedure Evaluation (Signed)
  Anesthesia Post-op Note  Patient: Ann Sims  Procedure(s) Performed: Procedure(s): POST PARTUM TUBAL LIGATION (Bilateral)  Patient Location: Mother/Baby  Anesthesia Type:Epidural  Level of Consciousness: awake  Airway and Oxygen Therapy: Patient Spontanous Breathing  Post-op Pain: mild  Post-op Assessment: Patient's Cardiovascular Status Stable and Respiratory Function Stable  Post-op Vital Signs: stable  Last Vitals:  Filed Vitals:   02/13/14 0804  BP: 98/50  Pulse: 76  Temp: 36.6 C  Resp: 18    Complications: No apparent anesthesia complications

## 2014-02-13 NOTE — Lactation Note (Signed)
This note was copied from the chart of Ann Keionna Kinnaird. Lactation Consultation Note  This is mom's first BF experience.  Mother is latching baby but reports a pain of a 9 on the pain scale with latch.  It decreases to a 4-5 after a couple of minutes but occasionally spikes momentarily.  Mother is asking about pumping and bottle feeding.  I explained supply and demand to her and the importance of having the baby at the breast at this point in time.  Dad is asking about formula because mom is going back to work in 6 weeks.  Parents may not be fully committed to BF.  Follow-up tomorrow.  Patient Name: Ann Sims TRVUY'E Date: 02/13/2014     Maternal Data    Feeding Feeding Type: Breast Fed Length of feed: 15 min  LATCH Score/Interventions Latch: Grasps breast easily, tongue down, lips flanged, rhythmical sucking.  Audible Swallowing: Spontaneous and intermittent  Type of Nipple: Everted at rest and after stimulation  Comfort (Breast/Nipple): Engorged, cracked, bleeding, large blisters, severe discomfort  Problem noted: Severe discomfort  Hold (Positioning): Assistance needed to correctly position infant at breast and maintain latch.  LATCH Score: 7  Lactation Tools Discussed/Used     Consult Status      Ann Sims 02/13/2014, 5:15 PM

## 2014-02-13 NOTE — Plan of Care (Signed)
Problem: Consults Goal: Postpartum Patient Education (See Patient Education module for education specifics.)  Outcome: Completed/Met Date Met:  02/13/14

## 2014-02-13 NOTE — Discharge Summary (Signed)
Obstetric Discharge Summary Reason for Admission: induction of labor Prenatal Procedures: ultrasound Intrapartum Procedures: spontaneous vaginal delivery Postpartum Procedures: none Complications-Operative and Postpartum: 2 degree perineal laceration HEMOGLOBIN  Date Value Ref Range Status  02/13/2014 9.3* 12.0 - 15.0 g/dL Final   HCT  Date Value Ref Range Status  02/13/2014 27.3* 36.0 - 46.0 % Final    Physical Exam:  General: alert and cooperative Lochia: appropriate Uterine Fundus: firm Incision: n/a DVT Evaluation: No evidence of DVT seen on physical exam.  Discharge Diagnoses: Term Pregnancy-delivered  Discharge Information: Date: 02/13/2014 Activity: pelvic rest Diet: routine Medications: PNV and Percocet Condition: stable Instructions: refer to practice specific booklet Discharge to: home Follow-up Information    Schedule an appointment as soon as possible for a visit in 6 weeks to follow up.      Newborn Data: Live born female  Birth Weight: 7 lb 14 oz (3572 g) APGAR: 8, 9  Home with mother.  Ann Sims 02/13/2014, 1:12 PM

## 2014-02-15 NOTE — Transfer of Care (Signed)
Immediate Anesthesia Transfer of Care Note  Patient: Ann Sims  Procedure(s) Performed: Epidural  Patient Location: L+D  Anesthesia Type:Epidural  Level of Consciousness: Awake  Airway & Oxygen Therapy: None Post-op Assessment: Stable  Post vital signs: VSSAF Complications: None

## 2014-03-22 ENCOUNTER — Other Ambulatory Visit: Payer: Self-pay | Admitting: Obstetrics and Gynecology

## 2014-03-23 LAB — CYTOLOGY - PAP

## 2014-03-26 NOTE — L&D Delivery Note (Signed)
2Delivery Note At 8:57 PM a viable female was delivered via Vaginal, Spontaneous Delivery (Presentation: Left Occiput Anterior).  APGAR:8/9 , ; weight  .   Placenta status:intact , . 3 vessel Cord:  with the following complications:none .  Cord pH: na  Anesthesia:  epidural Episiotomy:  none Lacerations:  Second degree Suture Repair: 2.0 chromic Est. Blood Loss (mL):    Mom to postpartum.  Baby to Couplet care / Skin to Skin.  Amalio Loe S 12/31/2014, 9:09 PM

## 2014-04-08 ENCOUNTER — Encounter (HOSPITAL_COMMUNITY): Payer: Self-pay | Admitting: Obstetrics and Gynecology

## 2014-05-05 NOTE — H&P (Signed)
  Patient name Ann Sims, Ann Sims DICTATION# [id number] CSN# [id number]  [facility], MD 05/05/2014 9:20 AM

## 2014-05-07 ENCOUNTER — Encounter (HOSPITAL_BASED_OUTPATIENT_CLINIC_OR_DEPARTMENT_OTHER): Payer: Self-pay | Admitting: *Deleted

## 2014-05-07 NOTE — H&P (Signed)
NAMEKENLYN, Sims                 ACCOUNT NO.:  [id number]  MEDICAL RECORD NO.:  [id number]  LOCATION:                                 FACILITY:  PHYSICIAN:  Darlyn Chamber, M.D.   DATE OF BIRTH:  [birth year]  DATE OF ADMISSION:  05/14/2014 DATE OF DISCHARGE:                             HISTORY & PHYSICAL   Date of her surgery is May 14, 2014, this is going to be done at [facility] area on [place]:  The patient is a 39 year old, gravida 29, para 2, married female, presents for laparoscopic bilateral tubal fulguration.  The patient desires a permanent sterilization. Alternative forms of birth control were discussed.  The potential irreversibility of sterilization was explained.  Failure rate of 1 in 200 is quoted.  Failures can be in the form of ectopic pregnancy, requiring further surgical management.  The patient expressed understanding and wish to proceed with permanent sterilization.  ALLERGIES:  She has no known drug allergies.  MEDICATIONS:  None.  PAST MEDICAL HISTORY:  The patient does have a history of migraine headaches.  She has had previous bilateral foot surgery to remove an extra bone in each foot in 2004.  OBSTETRICAL HISTORY:  Somewhat complicated.  She had an abortion in 2001, had an intrauterine fetal demise in 2002 at 19 weeks.  She had been hospitalized prior to that with pyelonephritis.  Had another pregnancy loss in 2004 with a DNA, had a vaginal delivery in 2010 and she also had a vaginal delivery this year.  FAMILY HISTORY:  Noncontributory.  SOCIAL HISTORY:  No tobacco or alcohol use.  REVIEW OF SYSTEMS:  Noncontributory.  PHYSICAL EXAMINATION:  VITAL SIGNS:  The patient is afebrile.  Stable vital signs. HEENT:  The patient is normocephalic.  Pupils equal, round, reactive to light and accommodation.  Extraocular movements are intact.  Sclerae and conjunctivae are clear.  Oropharynx clear. NECK:   Without thyromegaly. BREASTS:  No discrete masses. LUNGS:  Clear. CARDIOVASCULAR SYSTEM:  Regular rate.  No murmurs or gallops.  No carotid or abdominal bruits. ABDOMEN:  Benign.  No mass, organomegaly, or tenderness. PELVIC:  Normal external genitalia.  Vaginal mucosa is clear.  Cervix unremarkable.  Uterus normal size, shape, and contour.  Adnexa free of mass or tenderness. EXTREMITIES:  Trace edema. NEUROLOGIC:  Grossly within normal limits.  IMPRESSION:  Multiparity, desires sterility.  PLAN:  The patient will undergo laparoscopic bilateral tubal ligation. The risks of surgery have been discussed including the risk of infection.  Risk of hemorrhage that could require transfusion with the risk of AIDS, hepatitis.  Risk of injury to adjacent organs including bladder, bowel, ureters that could require further exploratory surgery. Risk of deep venous thrombosis and pulmonary embolus.  The patient expressed understanding of the indications, risks, and alternatives.     Darlyn Chamber, M.D.     JSM/MEDQ  D:  05/05/2014  T:  05/05/2014  Job:  [id number]

## 2014-05-10 ENCOUNTER — Encounter (HOSPITAL_BASED_OUTPATIENT_CLINIC_OR_DEPARTMENT_OTHER): Payer: Self-pay | Admitting: *Deleted

## 2014-05-10 NOTE — Progress Notes (Signed)
NPO AFTER MN. ARRIVE AT 0530. NEEDS CBC AND SERUM hCG.

## 2014-05-14 ENCOUNTER — Encounter (HOSPITAL_BASED_OUTPATIENT_CLINIC_OR_DEPARTMENT_OTHER)
Admission: RE | Disposition: A | Discharge status: Home / Self Care | Payer: Self-pay | Source: Ambulatory Visit | Attending: Obstetrics and Gynecology

## 2014-05-14 ENCOUNTER — Ambulatory Visit (HOSPITAL_BASED_OUTPATIENT_CLINIC_OR_DEPARTMENT_OTHER): Payer: BLUE CROSS/BLUE SHIELD | Admitting: Anesthesiology

## 2014-05-14 ENCOUNTER — Encounter (HOSPITAL_BASED_OUTPATIENT_CLINIC_OR_DEPARTMENT_OTHER): Payer: Self-pay

## 2014-05-14 ENCOUNTER — Ambulatory Visit (HOSPITAL_BASED_OUTPATIENT_CLINIC_OR_DEPARTMENT_OTHER)
Admission: RE | Admit: 2014-05-14 | Discharge: 2014-05-14 | Disposition: A | Discharge status: Home / Self Care | Payer: BLUE CROSS/BLUE SHIELD | Source: Ambulatory Visit | Attending: Obstetrics and Gynecology | Admitting: Obstetrics and Gynecology

## 2014-05-14 DIAGNOSIS — Z539 Procedure and treatment not carried out, unspecified reason: Secondary | ICD-10-CM | POA: Insufficient documentation

## 2014-05-14 DIAGNOSIS — Z302 Encounter for sterilization: Secondary | ICD-10-CM | POA: Diagnosis not present

## 2014-05-14 HISTORY — DX: Personal history of other diseases of the female genital tract: Z87.42

## 2014-05-14 HISTORY — DX: Presence of spectacles and contact lenses: Z97.3

## 2014-05-14 LAB — CBC
HEMATOCRIT: 33.3 % — AB (ref 36.0–46.0)
Hemoglobin: 11.3 g/dL — ABNORMAL LOW (ref 12.0–15.0)
MCH: 28.5 pg (ref 26.0–34.0)
MCHC: 33.9 g/dL (ref 30.0–36.0)
MCV: 83.9 fL (ref 78.0–100.0)
Platelets: 160 10*3/uL (ref 150–400)
RBC: 3.97 MIL/uL (ref 3.87–5.11)
RDW: 14.2 % (ref 11.5–15.5)
WBC: 7.6 10*3/uL (ref 4.0–10.5)

## 2014-05-14 LAB — HCG, SERUM, QUALITATIVE: Preg, Serum: POSITIVE — AB

## 2014-05-14 LAB — HCG, QUANTITATIVE, PREGNANCY: hCG, Beta Chain, Quant, S: 75443 m[IU]/mL — ABNORMAL HIGH (ref <5)

## 2014-05-14 SURGERY — LIGATION, FALLOPIAN TUBE, LAPAROSCOPIC
Anesthesia: General | Laterality: Bilateral

## 2014-05-14 MED ORDER — LACTATED RINGERS IV SOLN
INTRAVENOUS | Status: DC
  Administered 2014-05-14: 06:00:00 via INTRAVENOUS
  Filled 2014-05-14: qty 1000

## 2014-05-14 MED ORDER — MIDAZOLAM HCL 2 MG/2ML IJ SOLN
INTRAMUSCULAR | Status: AC
  Filled 2014-05-14: qty 2

## 2014-05-14 MED ORDER — FENTANYL CITRATE 0.05 MG/ML IJ SOLN
INTRAMUSCULAR | Status: AC
  Filled 2014-05-14: qty 6

## 2014-05-14 SURGICAL SUPPLY — 46 items
APPLICATOR COTTON TIP 6IN STRL (MISCELLANEOUS) IMPLANT
BAG URINE DRAINAGE (UROLOGICAL SUPPLIES) IMPLANT
BANDAGE ADHESIVE 1X3 (GAUZE/BANDAGES/DRESSINGS) IMPLANT
BLADE SURG 11 STRL SS (BLADE) IMPLANT
CANISTER SUCTION 1200CC (MISCELLANEOUS) IMPLANT
CANISTER SUCTION 2500CC (MISCELLANEOUS) IMPLANT
CATH FOLEY 2WAY SLVR  5CC 14FR (CATHETERS)
CATH FOLEY 2WAY SLVR 5CC 14FR (CATHETERS) IMPLANT
CATH ROBINSON RED A/P 16FR (CATHETERS) IMPLANT
COVER MAYO STAND STRL (DRAPES) IMPLANT
DRAPE CAMERA CLOSED 9X96 (DRAPES) IMPLANT
ELECT REM PT RETURN 9FT ADLT (ELECTROSURGICAL)
ELECTRODE REM PT RTRN 9FT ADLT (ELECTROSURGICAL) IMPLANT
FILTER SMOKE EVAC LAPAROSHD (FILTER) IMPLANT
GLOVE BIO SURGEON STRL SZ7 (GLOVE) IMPLANT
GOWN STRL REUS W/TWL XL LVL3 (GOWN DISPOSABLE) IMPLANT
LAPAROSCOPY HANDPIECE LONG (MISCELLANEOUS) IMPLANT
LEGGING LITHOTOMY PAIR STRL (DRAPES) IMPLANT
LIQUID BAND (GAUZE/BANDAGES/DRESSINGS) IMPLANT
NEEDLE INSUFFLATION 14GA 120MM (NEEDLE) IMPLANT
NS IRRIG 500ML POUR BTL (IV SOLUTION) IMPLANT
PACK BASIN DAY SURGERY FS (CUSTOM PROCEDURE TRAY) IMPLANT
PACK LAPAROSCOPY II (CUSTOM PROCEDURE TRAY) IMPLANT
PAD OB MATERNITY 4.3X12.25 (PERSONAL CARE ITEMS) IMPLANT
PAD PREP 24X48 CUFFED NSTRL (MISCELLANEOUS) IMPLANT
POUCH SPECIMEN RETRIEVAL 10MM (ENDOMECHANICALS) IMPLANT
SCISSORS LAP 5X35 DISP (ENDOMECHANICALS) IMPLANT
SEALER TISSUE G2 CVD JAW 35 (ENDOMECHANICALS) IMPLANT
SEALER TISSUE G2 CVD JAW 45CM (ENDOMECHANICALS) IMPLANT
SET IRRIG TUBING LAPAROSCOPIC (IRRIGATION / IRRIGATOR) IMPLANT
SOLUTION ANTI FOG 6CC (MISCELLANEOUS) IMPLANT
SUT VIC AB 3-0 PS2 18 (SUTURE)
SUT VIC AB 3-0 PS2 18XBRD (SUTURE) IMPLANT
SUT VICRYL 0 ENDOLOOP (SUTURE) IMPLANT
SUT VICRYL 0 UR6 27IN ABS (SUTURE) IMPLANT
SUT VICRYL 4-0 PS2 18IN ABS (SUTURE) IMPLANT
SYRINGE 10CC LL (SYRINGE) IMPLANT
TOWEL OR 17X24 6PK STRL BLUE (TOWEL DISPOSABLE) IMPLANT
TRAY DSU PREP LF (CUSTOM PROCEDURE TRAY) IMPLANT
TROCAR BALLN 12MMX100 BLUNT (TROCAR) IMPLANT
TROCAR OPTI TIP 5M 100M (ENDOMECHANICALS) IMPLANT
TROCAR XCEL DIL TIP R 11M (ENDOMECHANICALS) IMPLANT
TUBING INSUFFLATION 10FT LAP (TUBING) IMPLANT
VACUUM HOSE/TUBING 7/8INX6FT (MISCELLANEOUS) IMPLANT
WARMER LAPAROSCOPE (MISCELLANEOUS) IMPLANT
WATER STERILE IRR 500ML POUR (IV SOLUTION) IMPLANT

## 2014-05-14 NOTE — Anesthesia Preprocedure Evaluation (Addendum)
Anesthesia Evaluation    Airway        Dental   Pulmonary Current Smoker, former smoker,          Cardiovascular     Neuro/Psych    GI/Hepatic   Endo/Other    Renal/GU      Musculoskeletal   Abdominal   Peds  Hematology   Anesthesia Other Findings   Reproductive/Obstetrics (+) Pregnancy (preg test POSITIVE)                            Anesthesia Physical Anesthesia Plan  ASA: II  Anesthesia Plan:    Post-op Pain Management:    Induction:   Airway Management Planned:   Additional Equipment:   Intra-op Plan:   Post-operative Plan:   Informed Consent:   Plan Discussed with:   Anesthesia Plan Comments: (Dr Radene Knee cancelled )        Anesthesia Quick Evaluation

## 2014-06-01 LAB — OB RESULTS CONSOLE HIV ANTIBODY (ROUTINE TESTING): HIV: NONREACTIVE

## 2014-06-01 LAB — OB RESULTS CONSOLE ANTIBODY SCREEN: Antibody Screen: NEGATIVE

## 2014-06-01 LAB — OB RESULTS CONSOLE HEPATITIS B SURFACE ANTIGEN: HEP B S AG: NEGATIVE

## 2014-06-01 LAB — OB RESULTS CONSOLE RUBELLA ANTIBODY, IGM: RUBELLA: NON-IMMUNE/NOT IMMUNE

## 2014-06-01 LAB — OB RESULTS CONSOLE ABO/RH: RH Type: POSITIVE

## 2014-06-01 LAB — OB RESULTS CONSOLE GC/CHLAMYDIA
CHLAMYDIA, DNA PROBE: NEGATIVE
Gonorrhea: NEGATIVE

## 2014-06-01 LAB — OB RESULTS CONSOLE RPR: RPR: NONREACTIVE

## 2014-09-02 ENCOUNTER — Encounter (HOSPITAL_COMMUNITY): Payer: Self-pay | Admitting: Obstetrics and Gynecology

## 2014-12-21 ENCOUNTER — Encounter (HOSPITAL_COMMUNITY): Payer: Self-pay | Admitting: *Deleted

## 2014-12-21 NOTE — H&P (Signed)
  Patient name Ann Sims, Ann Sims DICTATION# [id number] CSN# [id number]  [facility], MD 12/21/2014 9:23 AM

## 2014-12-22 NOTE — H&P (Signed)
NAMEPEARLINA, FRIEDLY                 ACCOUNT NO.:  [id number]  MEDICAL RECORD NO.:  [id number]  LOCATION:  [facility]                         FACILITY:  [facility]  PHYSICIAN:  Darlyn Chamber, M.D.   DATE OF BIRTH:  [birth year]  DATE OF ADMISSION: DATE OF DISCHARGE:                             HISTORY & PHYSICAL   HISTORY OF PRESENT ILLNESS:  The patient is a 39 year old, gravida 7, para 3-0-3-1 female who presents for laparoscopic bilateral tubal ligation.  The patient desires a permanent sterilization.  Potential irreversibility of sterilization discussed.  Potential failure rate of 1:200, failures can be in the form of an ectopic pregnancy, requiring further surgical management.  The patient understands potentials and desires permanent sterilization.  Alternatives were discussed.  ALLERGIES:  In terms of allergies, no known drug allergies.  MEDICATIONS:  None.  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  Does have a history of migraine headaches.  She does have history of abnormal cervical cytology with biopsies suggestive of mild cervical dysplasia.  OBSTETRICAL HISTORY:  She has had 1 elective abortion in 2001 and 2002. She had a spontaneous abortion at 19 weeks in 2004.  She had an intrauterine fetal demise at 91 weeks.  In 2010, she had a vaginal delivery.  In 2014, she had a miscarriage.  In 2015, she had a vaginal delivery and this year, she had a vaginal delivery.  PAST SURGICAL HISTORY:  Includes the D and E's as noted above and bilateral foot surgery.  SOCIAL HISTORY:  Reveals no tobacco, alcohol use.  REVIEW OF SYSTEMS:  Noncontributory.  PHYSICAL EXAM:  GENERAL:  The patient is afebrile. VITAL SIGNS:  Stable vital signs. HEENT:  Patient normocephalic.  Pupils equal, round, reactive to light and accommodation.  Extraocular movements were intact.  Sclerae and conjunctivae were clear.  Oropharynx clear.  NECK:  Not  examined. LUNGS:  Clear. CARDIOVASCULAR:  Regular rate without murmurs or gallops. ABDOMEN:  Benign.  No mass, organomegaly, or tenderness. PELVIC:  Normal external genitalia.  Vaginal mucosa is clear.  Cervix unremarkable.  Uterus normal size, shape, and contour.  Adnexa free of masses or tenderness.  IMPRESSION:  Multiparity, desires sterility.  PLAN:  The patient will undergo laparoscopic and bilateral tubal fulguration.  The risks of surgery have been discussed including the risk of infection, the risk of hemorrhage that could require transfusion with the risk of AIDS or hepatitis.  Risk of injury to adjacent organs including bladder, bowel, and ureters.  Risk of deep venous thrombosis and pulmonary embolus.  The patient expressed understand of potential risks and complications.     Darlyn Chamber, M.D.     JSM/MEDQ  D:  12/21/2014  T:  12/22/2014  Job:  [id number]

## 2014-12-30 ENCOUNTER — Telehealth (HOSPITAL_COMMUNITY): Payer: Self-pay | Admitting: *Deleted

## 2014-12-30 ENCOUNTER — Other Ambulatory Visit (HOSPITAL_BASED_OUTPATIENT_CLINIC_OR_DEPARTMENT_OTHER): Payer: Self-pay | Admitting: Obstetrics and Gynecology

## 2014-12-30 ENCOUNTER — Encounter (HOSPITAL_COMMUNITY): Payer: Self-pay | Admitting: *Deleted

## 2014-12-30 LAB — OB RESULTS CONSOLE GBS: STREP GROUP B AG: NEGATIVE

## 2014-12-30 NOTE — Telephone Encounter (Signed)
Preadmission screen  

## 2014-12-31 ENCOUNTER — Encounter (HOSPITAL_COMMUNITY): Payer: Self-pay

## 2014-12-31 ENCOUNTER — Inpatient Hospital Stay (HOSPITAL_COMMUNITY): Payer: BLUE CROSS/BLUE SHIELD | Admitting: Anesthesiology

## 2014-12-31 ENCOUNTER — Inpatient Hospital Stay (HOSPITAL_COMMUNITY)
Admission: RE | Admit: 2014-12-31 | Discharge: 2015-01-01 | DRG: 767 | Disposition: A | Discharge status: Home / Self Care | Payer: BLUE CROSS/BLUE SHIELD | Source: Ambulatory Visit | Attending: Obstetrics and Gynecology | Admitting: Obstetrics and Gynecology

## 2014-12-31 DIAGNOSIS — Z809 Family history of malignant neoplasm, unspecified: Secondary | ICD-10-CM | POA: Diagnosis not present

## 2014-12-31 DIAGNOSIS — O09523 Supervision of elderly multigravida, third trimester: Secondary | ICD-10-CM

## 2014-12-31 DIAGNOSIS — Z302 Encounter for sterilization: Secondary | ICD-10-CM | POA: Diagnosis not present

## 2014-12-31 DIAGNOSIS — Z3A39 39 weeks gestation of pregnancy: Secondary | ICD-10-CM | POA: Diagnosis not present

## 2014-12-31 DIAGNOSIS — Z87891 Personal history of nicotine dependence: Secondary | ICD-10-CM

## 2014-12-31 DIAGNOSIS — O480 Post-term pregnancy: Secondary | ICD-10-CM | POA: Diagnosis present

## 2014-12-31 DIAGNOSIS — R51 Headache: Secondary | ICD-10-CM | POA: Diagnosis present

## 2014-12-31 DIAGNOSIS — O9902 Anemia complicating childbirth: Secondary | ICD-10-CM | POA: Diagnosis present

## 2014-12-31 DIAGNOSIS — Z8249 Family history of ischemic heart disease and other diseases of the circulatory system: Secondary | ICD-10-CM

## 2014-12-31 LAB — CBC
HEMATOCRIT: 30 % — AB (ref 36.0–46.0)
Hemoglobin: 10.2 g/dL — ABNORMAL LOW (ref 12.0–15.0)
MCH: 29.2 pg (ref 26.0–34.0)
MCHC: 34 g/dL (ref 30.0–36.0)
MCV: 86 fL (ref 78.0–100.0)
PLATELETS: 134 10*3/uL — AB (ref 150–400)
RBC: 3.49 MIL/uL — ABNORMAL LOW (ref 3.87–5.11)
RDW: 15.8 % — AB (ref 11.5–15.5)
WBC: 12.1 10*3/uL — AB (ref 4.0–10.5)

## 2014-12-31 MED ORDER — ACETAMINOPHEN 325 MG PO TABS: 650.0000 mg | ORAL_TABLET | ORAL | Status: DC | PRN

## 2014-12-31 MED ORDER — FLEET ENEMA 7-19 GM/118ML RE ENEM: 1.0000 | ENEMA | Freq: Every day | RECTAL | Status: DC | PRN

## 2014-12-31 MED ORDER — PRENATAL MULTIVITAMIN CH
1.0000 | ORAL_TABLET | Freq: Every day | ORAL | Status: DC
  Administered 2015-01-01: 1 via ORAL
  Filled 2014-12-31: qty 1

## 2014-12-31 MED ORDER — OXYCODONE-ACETAMINOPHEN 5-325 MG PO TABS: 2.0000 | ORAL_TABLET | ORAL | Status: DC | PRN

## 2014-12-31 MED ORDER — PHENYLEPHRINE 40 MCG/ML (10ML) SYRINGE FOR IV PUSH (FOR BLOOD PRESSURE SUPPORT)
80.0000 ug | PREFILLED_SYRINGE | INTRAVENOUS | Status: DC | PRN
Start: 2014-12-31 — End: 2014-12-31
  Filled 2014-12-31: qty 20

## 2014-12-31 MED ORDER — OXYCODONE-ACETAMINOPHEN 5-325 MG PO TABS: 1.0000 | ORAL_TABLET | ORAL | Status: DC | PRN

## 2014-12-31 MED ORDER — FENTANYL 2.5 MCG/ML BUPIVACAINE 1/10 % EPIDURAL INFUSION (WH - ANES)
14.0000 mL/h | INTRAMUSCULAR | Status: DC | PRN
  Administered 2014-12-31 (×3): 14 mL/h via EPIDURAL
  Filled 2014-12-31 (×2): qty 125

## 2014-12-31 MED ORDER — BISACODYL 10 MG RE SUPP: 10.0000 mg | Freq: Every day | RECTAL | Status: DC | PRN

## 2014-12-31 MED ORDER — ONDANSETRON HCL 4 MG PO TABS: 4.0000 mg | ORAL_TABLET | ORAL | Status: DC | PRN

## 2014-12-31 MED ORDER — LANOLIN HYDROUS EX OINT: TOPICAL_OINTMENT | CUTANEOUS | Status: DC | PRN

## 2014-12-31 MED ORDER — LIDOCAINE HCL (PF) 1 % IJ SOLN
30.0000 mL | INTRAMUSCULAR | Status: DC | PRN
  Administered 2014-12-31: 30 mL via SUBCUTANEOUS
  Filled 2014-12-31: qty 30

## 2014-12-31 MED ORDER — LACTATED RINGERS IV SOLN: 500.0000 mL | INTRAVENOUS | Status: DC | PRN

## 2014-12-31 MED ORDER — BENZOCAINE-MENTHOL 20-0.5 % EX AERO
1.0000 "application " | INHALATION_SPRAY | CUTANEOUS | Status: DC | PRN
  Administered 2015-01-01: 1 via TOPICAL
  Filled 2014-12-31: qty 56

## 2014-12-31 MED ORDER — OXYTOCIN 40 UNITS IN LACTATED RINGERS INFUSION - SIMPLE MED
1.0000 m[IU]/min | INTRAVENOUS | Status: DC
  Administered 2014-12-31: 2 m[IU]/min via INTRAVENOUS

## 2014-12-31 MED ORDER — ZOLPIDEM TARTRATE 5 MG PO TABS: 5.0000 mg | ORAL_TABLET | Freq: Every evening | ORAL | Status: DC | PRN

## 2014-12-31 MED ORDER — SENNOSIDES-DOCUSATE SODIUM 8.6-50 MG PO TABS
2.0000 | ORAL_TABLET | ORAL | Status: DC
  Administered 2015-01-01: 2 via ORAL
  Filled 2014-12-31: qty 2

## 2014-12-31 MED ORDER — TETANUS-DIPHTH-ACELL PERTUSSIS 5-2.5-18.5 LF-MCG/0.5 IM SUSP: 0.5000 mL | Freq: Once | INTRAMUSCULAR | Status: DC

## 2014-12-31 MED ORDER — OXYTOCIN 40 UNITS IN LACTATED RINGERS INFUSION - SIMPLE MED
62.5000 mL/h | INTRAVENOUS | Status: DC
  Administered 2014-12-31: 62.5 mL/h via INTRAVENOUS
  Filled 2014-12-31: qty 1000

## 2014-12-31 MED ORDER — SIMETHICONE 80 MG PO CHEW: 80.0000 mg | CHEWABLE_TABLET | ORAL | Status: DC | PRN

## 2014-12-31 MED ORDER — OXYCODONE-ACETAMINOPHEN 5-325 MG PO TABS
2.0000 | ORAL_TABLET | ORAL | Status: DC | PRN
  Administered 2015-01-01 (×2): 2 via ORAL
  Filled 2014-12-31 (×2): qty 2

## 2014-12-31 MED ORDER — TERBUTALINE SULFATE 1 MG/ML IJ SOLN: 0.2500 mg | Freq: Once | INTRAMUSCULAR | Status: DC | PRN

## 2014-12-31 MED ORDER — FLEET ENEMA 7-19 GM/118ML RE ENEM: 1.0000 | ENEMA | RECTAL | Status: DC | PRN

## 2014-12-31 MED ORDER — IBUPROFEN 600 MG PO TABS
600.0000 mg | ORAL_TABLET | Freq: Four times a day (QID) | ORAL | Status: DC
Start: 2015-01-01 — End: 2015-01-02
  Administered 2015-01-01 (×3): 600 mg via ORAL
  Filled 2014-12-31 (×3): qty 1

## 2014-12-31 MED ORDER — LIDOCAINE HCL (PF) 1 % IJ SOLN
INTRAMUSCULAR | Status: DC | PRN
  Administered 2014-12-31: 2 mL via EPIDURAL
  Administered 2014-12-31: 3 mL via EPIDURAL
  Administered 2014-12-31: 5 mL via EPIDURAL

## 2014-12-31 MED ORDER — DIBUCAINE 1 % RE OINT: 1.0000 "application " | TOPICAL_OINTMENT | RECTAL | Status: DC | PRN

## 2014-12-31 MED ORDER — DIPHENHYDRAMINE HCL 50 MG/ML IJ SOLN
12.5000 mg | INTRAMUSCULAR | Status: DC | PRN
  Administered 2014-12-31: 12.5 mg via INTRAVENOUS
  Filled 2014-12-31: qty 1

## 2014-12-31 MED ORDER — EPHEDRINE 5 MG/ML INJ
10.0000 mg | INTRAVENOUS | Status: DC | PRN
Start: 2014-12-31 — End: 2014-12-31

## 2014-12-31 MED ORDER — OXYCODONE-ACETAMINOPHEN 5-325 MG PO TABS
1.0000 | ORAL_TABLET | ORAL | Status: DC | PRN
  Administered 2015-01-01: 1 via ORAL
  Filled 2014-12-31: qty 1

## 2014-12-31 MED ORDER — OXYTOCIN BOLUS FROM INFUSION: 500.0000 mL | INTRAVENOUS | Status: DC

## 2014-12-31 MED ORDER — WITCH HAZEL-GLYCERIN EX PADS: 1.0000 "application " | MEDICATED_PAD | CUTANEOUS | Status: DC | PRN

## 2014-12-31 MED ORDER — ONDANSETRON HCL 4 MG/2ML IJ SOLN: 4.0000 mg | Freq: Four times a day (QID) | INTRAMUSCULAR | Status: DC | PRN

## 2014-12-31 MED ORDER — DIPHENHYDRAMINE HCL 25 MG PO CAPS: 25.0000 mg | ORAL_CAPSULE | Freq: Four times a day (QID) | ORAL | Status: DC | PRN

## 2014-12-31 MED ORDER — CITRIC ACID-SODIUM CITRATE 334-500 MG/5ML PO SOLN: 30.0000 mL | ORAL | Status: DC | PRN

## 2014-12-31 MED ORDER — LACTATED RINGERS IV SOLN: INTRAVENOUS | Status: DC

## 2014-12-31 MED ORDER — ONDANSETRON HCL 4 MG/2ML IJ SOLN
4.0000 mg | INTRAMUSCULAR | Status: DC | PRN
  Administered 2015-01-01: 4 mg via INTRAVENOUS

## 2014-12-31 NOTE — Anesthesia Preprocedure Evaluation (Signed)
Anesthesia Evaluation  Patient identified by MRN, date of birth, ID band Patient awake    Reviewed: Allergy & Precautions, NPO status , Patient's Chart, lab work & pertinent test results  History of Anesthesia Complications Negative for: history of anesthetic complications  Airway Mallampati: II  TM Distance: >3 FB Neck ROM: Full    Dental  (+) Teeth Intact, Dental Advisory Given   Pulmonary former smoker,    Pulmonary exam normal breath sounds clear to auscultation       Cardiovascular negative cardio ROS Normal cardiovascular exam Rhythm:Regular Rate:Normal     Neuro/Psych  Headaches, negative psych ROS   GI/Hepatic negative GI ROS, Neg liver ROS,   Endo/Other  Obesity   Renal/GU negative Renal ROS     Musculoskeletal negative musculoskeletal ROS (+)   Abdominal   Peds  Hematology  (+) Blood dyscrasia, anemia ,   Anesthesia Other Findings Day of surgery medications reviewed with the patient.  Reproductive/Obstetrics (+) Pregnancy                             Anesthesia Physical Anesthesia Plan  ASA: II  Anesthesia Plan: Epidural   Post-op Pain Management:    Induction:   Airway Management Planned:   Additional Equipment:   Intra-op Plan:   Post-operative Plan:   Informed Consent: I have reviewed the patients History and Physical, chart, labs and discussed the procedure including the risks, benefits and alternatives for the proposed anesthesia with the patient or authorized representative who has indicated his/her understanding and acceptance.   Dental advisory given  Plan Discussed with:   Anesthesia Plan Comments: (Patient identified. Risks/Benefits/Options discussed with patient including but not limited to bleeding, infection, nerve damage, paralysis, failed block, incomplete pain control, headache, blood pressure changes, nausea, vomiting, reactions to medication  both or allergic, itching and postpartum back pain. Confirmed with bedside nurse the patient's most recent platelet count. Confirmed with patient that they are not currently taking any anticoagulation, have any bleeding history or any family history of bleeding disorders. Patient expressed understanding and wished to proceed. All questions were answered. )        Anesthesia Quick Evaluation

## 2014-12-31 NOTE — H&P (Signed)
Ann Sims is a 39 y.o. female presenting at 31 + weeks for IOL.  Negative GBS. Maternal Medical History:  Fetal activity: Perceived fetal activity is normal.    Prenatal complications: no prenatal complications Prenatal Complications - Diabetes: none.    OB History    Gravida Para Term Preterm AB TAB SAB Ectopic Multiple Living   7 2 2  0 4 1 3   0 2     Past Medical History  Diagnosis Date  . Headache   . History of abnormal cervical Pap smear   . Wears glasses   . Anemia   . SVD (spontaneous vaginal delivery)     x 2  . Vaginal Pap smear, abnormal   . Hx of varicella   . AMA (advanced maternal age) multigravida 35+    Past Surgical History  Procedure Laterality Date  . Foot surgery Bilateral 2003     remove extra bones in feet  . Dilation and curettage of uterus  few times - last one 2005    w/ suction for missed abortion   Family History: family history includes Cancer in her maternal grandmother; Depression in her father and paternal aunt; Heart attack in her father; Heart disease in her father; Hypertension in her father. Social History:  reports that she quit smoking about 19 months ago. Her smoking use included Cigarettes. She has a 15 pack-year smoking history. She has never used smokeless tobacco. She reports that she drinks alcohol. She reports that she does not use illicit drugs.   Prenatal Transfer Tool  Maternal Diabetes: No Genetic Screening: Normal Maternal Ultrasounds/Referrals: Normal Fetal Ultrasounds or other Referrals:  None Maternal Substance Abuse:  No Significant Maternal Medications:  None Significant Maternal Lab Results:  Lab values include: Group B Strep negative Other Comments:  None  ROS    Last menstrual period 04/12/2014, not currently breastfeeding. Maternal Exam:  Uterine Assessment: Contraction strength is mild.  Contraction frequency is irregular.   Abdomen: Patient reports no abdominal tenderness. Fundal height is c/w sates.    Fetal presentation: vertex  Pelvis: adequate for delivery.   Cervix: 3cm and 50 %  Physical Exam  Prenatal labs: ABO, Rh: AB/Positive/-- (03/08 0000) Antibody: Negative (03/08 0000) Rubella: Nonimmune (03/08 0000) RPR: Nonreactive (03/08 0000)  HBsAg: Negative (03/08 0000)  HIV: Non-reactive (03/08 0000)  GBS: Negative (10/06 0000)   Assessment/Plan: IUP at 60 + for IOL Desires sterilization Plan arom. Risk of pitocin discussed   Ann Sims S 12/31/2014, 7:31 AM

## 2014-12-31 NOTE — Anesthesia Procedure Notes (Signed)
Epidural Patient location during procedure: OB  Staffing Anesthesiologist: Catalina Gravel Performed by: anesthesiologist   Preanesthetic Checklist Completed: patient identified, pre-op evaluation, timeout performed, IV checked, risks and benefits discussed and monitors and equipment checked  Epidural Patient position: sitting Prep: DuraPrep Patient monitoring: blood pressure and continuous pulse ox Approach: midline Location: L3-L4 Injection technique: LOR air  Needle:  Needle type: Tuohy  Needle gauge: 17 G Needle length: 9 cm Needle insertion depth: 4.5 cm Catheter size: 19 Gauge Catheter at skin depth: 14 and 9.5 cm Test dose: negative and Other (1% Lidocaine)  Additional Notes Patient identified.  Risk benefits discussed including failed block, incomplete pain control, headache, nerve damage, paralysis, blood pressure changes, nausea, vomiting, reactions to medication both toxic or allergic, and postpartum back pain.  Patient expressed understanding and wished to proceed.  All questions were answered.  Sterile technique used throughout procedure and epidural site dressed with sterile barrier dressing. No paresthesia or other complications noted. The patient did not experience any signs of intravascular injection such as tinnitus or metallic taste in mouth nor signs of intrathecal spread such as rapid motor block. Please see nursing notes for vital signs. Reason for block:procedure for pain

## 2014-12-31 NOTE — Progress Notes (Signed)
Patient ID: Ann Sims, female   DOB: 1976-03-11, 39 y.o.   MRN: [id number] CERVIX 4 50 %  AROM NO CTX'S WILL GIVE EPIDURAL BEGIN PITOCIN RISK DISCUSSED

## 2015-01-01 ENCOUNTER — Ambulatory Visit (HOSPITAL_COMMUNITY)
Admission: RE | Admit: 2015-01-01 | Payer: BLUE CROSS/BLUE SHIELD | Source: Ambulatory Visit | Admitting: Obstetrics and Gynecology

## 2015-01-01 ENCOUNTER — Encounter (HOSPITAL_COMMUNITY): Payer: Self-pay

## 2015-01-01 ENCOUNTER — Inpatient Hospital Stay (HOSPITAL_COMMUNITY): Payer: BLUE CROSS/BLUE SHIELD | Admitting: Anesthesiology

## 2015-01-01 ENCOUNTER — Encounter (HOSPITAL_COMMUNITY)
Admission: RE | Disposition: A | Discharge status: Home / Self Care | Payer: Self-pay | Source: Ambulatory Visit | Attending: Obstetrics and Gynecology

## 2015-01-01 HISTORY — PX: TUBAL LIGATION: SHX77

## 2015-01-01 LAB — CBC
HCT: 27.4 % — ABNORMAL LOW (ref 36.0–46.0)
Hemoglobin: 9.3 g/dL — ABNORMAL LOW (ref 12.0–15.0)
MCH: 29.5 pg (ref 26.0–34.0)
MCHC: 33.9 g/dL (ref 30.0–36.0)
MCV: 87 fL (ref 78.0–100.0)
Platelets: 126 10*3/uL — ABNORMAL LOW (ref 150–400)
RBC: 3.15 MIL/uL — ABNORMAL LOW (ref 3.87–5.11)
RDW: 15.8 % — ABNORMAL HIGH (ref 11.5–15.5)
WBC: 13.9 10*3/uL — ABNORMAL HIGH (ref 4.0–10.5)

## 2015-01-01 LAB — TYPE AND SCREEN
ABO/RH(D): AB POS
ANTIBODY SCREEN: NEGATIVE

## 2015-01-01 LAB — RPR: RPR Ser Ql: NONREACTIVE

## 2015-01-01 SURGERY — LIGATION, FALLOPIAN TUBE, POSTPARTUM
Anesthesia: Epidural | Site: Abdomen | Laterality: Bilateral

## 2015-01-01 MED ORDER — METOCLOPRAMIDE HCL 10 MG PO TABS
10.0000 mg | ORAL_TABLET | Freq: Once | ORAL | Status: AC
  Administered 2015-01-01: 10 mg via ORAL
  Filled 2015-01-01: qty 1

## 2015-01-01 MED ORDER — KETOROLAC TROMETHAMINE 30 MG/ML IJ SOLN
INTRAMUSCULAR | Status: AC
  Filled 2015-01-01: qty 1

## 2015-01-01 MED ORDER — MIDAZOLAM HCL 5 MG/5ML IJ SOLN
INTRAMUSCULAR | Status: DC | PRN
  Administered 2015-01-01 (×2): 1 mg via INTRAVENOUS

## 2015-01-01 MED ORDER — FENTANYL CITRATE (PF) 100 MCG/2ML IJ SOLN
INTRAMUSCULAR | Status: DC | PRN
  Administered 2015-01-01 (×2): 50 ug via INTRAVENOUS

## 2015-01-01 MED ORDER — SODIUM BICARBONATE 8.4 % IV SOLN
INTRAVENOUS | Status: AC
  Filled 2015-01-01: qty 50

## 2015-01-01 MED ORDER — LIDOCAINE HCL (CARDIAC) 20 MG/ML IV SOLN
INTRAVENOUS | Status: AC
  Filled 2015-01-01: qty 5

## 2015-01-01 MED ORDER — MIDAZOLAM HCL 2 MG/2ML IJ SOLN
INTRAMUSCULAR | Status: AC
  Filled 2015-01-01: qty 4

## 2015-01-01 MED ORDER — FAMOTIDINE 20 MG PO TABS
40.0000 mg | ORAL_TABLET | Freq: Once | ORAL | Status: AC
  Administered 2015-01-01: 40 mg via ORAL
  Filled 2015-01-01: qty 2

## 2015-01-01 MED ORDER — LIDOCAINE-EPINEPHRINE (PF) 2 %-1:200000 IJ SOLN
INTRAMUSCULAR | Status: AC
  Filled 2015-01-01: qty 20

## 2015-01-01 MED ORDER — PROPOFOL 10 MG/ML IV BOLUS
INTRAVENOUS | Status: DC | PRN
  Administered 2015-01-01 (×2): 20 mg via INTRAVENOUS

## 2015-01-01 MED ORDER — FENTANYL CITRATE (PF) 100 MCG/2ML IJ SOLN
INTRAMUSCULAR | Status: AC
  Filled 2015-01-01: qty 4

## 2015-01-01 MED ORDER — MEASLES, MUMPS & RUBELLA VAC ~~LOC~~ INJ
0.5000 mL | INJECTION | Freq: Once | SUBCUTANEOUS | Status: DC
  Filled 2015-01-01 (×2): qty 0.5

## 2015-01-01 MED ORDER — SODIUM BICARBONATE 8.4 % IV SOLN
INTRAVENOUS | Status: DC | PRN
  Administered 2015-01-01 (×2): 5 mL via EPIDURAL

## 2015-01-01 MED ORDER — INFLUENZA VAC SPLIT QUAD 0.5 ML IM SUSY
0.5000 mL | PREFILLED_SYRINGE | INTRAMUSCULAR | Status: AC
  Administered 2015-01-01: 0.5 mL via INTRAMUSCULAR

## 2015-01-01 MED ORDER — LACTATED RINGERS IV SOLN
INTRAVENOUS | Status: DC
  Administered 2015-01-01: 20 mL/h via INTRAVENOUS
  Administered 2015-01-01: 10:00:00 via INTRAVENOUS

## 2015-01-01 MED ORDER — LIDOCAINE HCL (CARDIAC) 20 MG/ML IV SOLN
INTRAVENOUS | Status: DC | PRN
  Administered 2015-01-01: 30 mg via INTRAVENOUS

## 2015-01-01 MED ORDER — FENTANYL CITRATE (PF) 100 MCG/2ML IJ SOLN: 25.0000 ug | INTRAMUSCULAR | Status: DC | PRN

## 2015-01-01 MED ORDER — ONDANSETRON HCL 4 MG/2ML IJ SOLN: 4.0000 mg | Freq: Once | INTRAMUSCULAR | Status: DC | PRN

## 2015-01-01 MED ORDER — KETOROLAC TROMETHAMINE 30 MG/ML IJ SOLN
INTRAMUSCULAR | Status: DC | PRN
  Administered 2015-01-01: 30 mg via INTRAVENOUS

## 2015-01-01 MED ORDER — OXYCODONE-ACETAMINOPHEN 7.5-325 MG PO TABS: 1.0000 | ORAL_TABLET | ORAL | Status: AC | PRN

## 2015-01-01 MED ORDER — PROPOFOL 10 MG/ML IV BOLUS
INTRAVENOUS | Status: AC
  Filled 2015-01-01: qty 20

## 2015-01-01 SURGICAL SUPPLY — 20 items
CLOTH BEACON ORANGE TIMEOUT ST (SAFETY) ×2 IMPLANT
CONTAINER PREFILL 10% NBF 15ML (MISCELLANEOUS) ×4 IMPLANT
DRSG OPSITE POSTOP 3X4 (GAUZE/BANDAGES/DRESSINGS) ×2 IMPLANT
ELECT REM PT RETURN 9FT ADLT (ELECTROSURGICAL) ×2
ELECTRODE REM PT RTRN 9FT ADLT (ELECTROSURGICAL) ×1 IMPLANT
GLOVE BIO SURGEON STRL SZ7 (GLOVE) ×2 IMPLANT
GOWN STRL REUS W/TWL LRG LVL3 (GOWN DISPOSABLE) ×4 IMPLANT
NEEDLE HYPO 22GX1.5 SAFETY (NEEDLE) ×2 IMPLANT
NS IRRIG 1000ML POUR BTL (IV SOLUTION) ×2 IMPLANT
PACK ABDOMINAL MINOR (CUSTOM PROCEDURE TRAY) ×2 IMPLANT
PENCIL BUTTON HOLSTER BLD 10FT (ELECTRODE) ×2 IMPLANT
SPONGE LAP 4X18 X RAY DECT (DISPOSABLE) ×2 IMPLANT
SUT PLAIN 0 NONE (SUTURE) ×2 IMPLANT
SUT VIC AB 0 CT1 27 (SUTURE) ×1
SUT VIC AB 0 CT1 27XBRD ANBCTR (SUTURE) ×1 IMPLANT
SUT VIC AB 4-0 PS2 27 (SUTURE) ×2 IMPLANT
SYR CONTROL 10ML LL (SYRINGE) ×2 IMPLANT
TOWEL OR 17X24 6PK STRL BLUE (TOWEL DISPOSABLE) ×4 IMPLANT
TRAY FOLEY CATH SILVER 14FR (SET/KITS/TRAYS/PACK) ×2 IMPLANT
WATER STERILE IRR 1000ML POUR (IV SOLUTION) IMPLANT

## 2015-01-01 NOTE — Anesthesia Postprocedure Evaluation (Signed)
  Anesthesia Post-op Note  Patient: Ann Sims  Procedure(s) Performed: * No procedures listed *  Patient Location: PACU and Mother/Baby  Anesthesia Type:Epidural  Level of Consciousness: awake, alert  and oriented  Airway and Oxygen Therapy: Patient Spontanous Breathing  Post-op Pain: mild  Post-op Assessment: Post-op Vital signs reviewed LLE Motor Response: Purposeful movement LLE Sensation: Decreased RLE Motor Response: Purposeful movement RLE Sensation: Decreased      Post-op Vital Signs: Reviewed and stable  Last Vitals:  Filed Vitals:   01/01/15 1130  BP: 122/54  Pulse: 92  Temp: 36.3 C  Resp: 18    Complications: No apparent anesthesia complications

## 2015-01-01 NOTE — Transfer of Care (Signed)
Immediate Anesthesia Transfer of Care Note  Patient: Ann Sims  Procedure(s) Performed: Procedure(s) with comments: POST PARTUM TUBAL LIGATION (Bilateral) - patient having induction of labor on Friday 12/31/14  Patient Location: PACU  Anesthesia Type:Spinal  Level of Consciousness: awake, alert  and oriented  Airway & Oxygen Therapy: Patient Spontanous Breathing and Patient connected to nasal cannula oxygen  Post-op Assessment: Report given to RN  Post vital signs: Reviewed  Last Vitals:  Filed Vitals:   01/01/15 0722  BP: 102/55  Pulse: 89  Temp: 36.8 C  Resp: 18    Complications: No apparent anesthesia complications

## 2015-01-01 NOTE — Discharge Summary (Signed)
Obstetric Discharge Summary Reason for Admission: induction of labor Prenatal Procedures: none Intrapartum Procedures: spontaneous vaginal delivery and tubal ligation Postpartum Procedures: none Complications-Operative and Postpartum: none HEMOGLOBIN  Date Value Ref Range Status  01/01/2015 9.3* 12.0 - 15.0 g/dL Final   HCT  Date Value Ref Range Status  01/01/2015 27.4* 36.0 - 46.0 % Final    Physical Exam:  General: alert Lochia: appropriate Uterine Fundus: firm Incision: healing well DVT Evaluation: No evidence of DVT seen on physical exam.  Discharge Diagnoses: Term Pregnancy-delivered  Discharge Information: Date: 01/01/2015 Activity: pelvic rest Diet: routine Medications: Percocet Condition: stable Instructions: refer to practice specific booklet Discharge to: home   Newborn Data: Live born female  Birth Weight: 8 lb 12 oz (3969 g) APGAR: 7, 9  Home with mother.  Ann Sims 01/01/2015, 5:05 PM

## 2015-01-01 NOTE — Brief Op Note (Signed)
12/31/2014 - 01/01/2015  8:26 AM  PATIENT:  Ann Sims  39 y.o. female  PRE-OPERATIVE DIAGNOSIS:  desires sterility  POST-OPERATIVE DIAGNOSIS:  desires sterility  PROCEDURE:  Procedure(s) with comments: POST PARTUM TUBAL LIGATION (Bilateral) - patient having induction of labor on Friday 12/31/14  SURGEON:  Surgeon(s) and Role:    * Arvella Nigh, MD - Primary  PHYSICIAN ASSISTANT:   ASSISTANTS: none   ANESTHESIA:   spinal  EBL:  Total I/O In: 800 [I.V.:800] Out: 110 [Urine:100; Blood:10]  BLOOD ADMINISTERED:none  DRAINS: Urinary Catheter (Foley)   LOCAL MEDICATIONS USED:  NONE  SPECIMEN:  Source of Specimen:  tubal segments  DISPOSITION OF SPECIMEN:  PATHOLOGY  COUNTS:  YES  TOURNIQUET:  * No tourniquets in log *  DICTATION: .Other Dictation: Dictation Number [id number]  PLAN OF CARE: Admit to inpatient   PATIENT DISPOSITION:  PACU - hemodynamically stable.   Delay start of Pharmacological VTE agent (>24hrs) due to surgical blood loss or risk of bleeding: no

## 2015-01-01 NOTE — Anesthesia Postprocedure Evaluation (Deleted)
  Anesthesia Post-op Note  Patient: Ann Sims  Procedure(s) Performed: * No procedures listed *  Patient Location: PACU and Mother/Baby  Anesthesia Type:Epidural  Level of Consciousness: awake, alert  and oriented  Airway and Oxygen Therapy: Patient Spontanous Breathing  Post-op Pain: mild  Post-op Assessment: Post-op Vital signs reviewed LLE Motor Response: Purposeful movement LLE Sensation: Decreased RLE Motor Response: Purposeful movement RLE Sensation: Decreased      Post-op Vital Signs: Reviewed and stable  Last Vitals:  Filed Vitals:   01/01/15 1130  BP: 122/54  Pulse: 92  Temp: 36.3 C  Resp: 18    Complications: No apparent anesthesia complications

## 2015-01-01 NOTE — Lactation Note (Signed)
This note was copied from the chart of Ann Sims. Lactation Consultation Note  Patient Name: Ann Lenox Ladouceur [id number] Date: 01/01/2015 Reason for consult: Initial assessment Mom reports she is going to try to BF. She tried with her last baby but reports BF, supplementing, pumping and still had LMS, then her milk dried up by 2 weeks.  Mom had BTL this am so baby has had lots of bottles today. [place] reviewed basic teaching and the importance of BF with each feeding to encourage milk production. If baby does not go to the breast, advised Mom needs to pump if she plans to maximize milk production. Reviewed supply/demand. Mom reports she has DEBP at home. Mom allowed [facility] to assist with latching baby at this visit. Advised baby should be at the breast 8-12 times in 24 hours and with feeding ques. If Mom continues to supplement, discussed feeding plan of BF with each feeding 15 minutes on each breast. FOB to give supplement while she pumps for 15 minutes to maximize milk production. Due to last baby having weight loss initially [place] feels Mom plans to supplement at this time. Unsure if Mom will continue BF or switch to formula.  Lactation brochure left for review, advised of OP services and support group. Encouraged to call for questions/concerns. Mom to advise if she would like DEBP for use while in the hospital.   Maternal Data    Feeding Feeding Type: Breast Fed Nipple Type: Slow - flow  LATCH Score/Interventions Latch: Repeated attempts needed to sustain latch, nipple held in mouth throughout feeding, stimulation needed to elicit sucking reflex. Intervention(s): Adjust position;Assist with latch;Breast massage;Breast compression  Audible Swallowing: A few with stimulation  Type of Nipple: Everted at rest and after stimulation  Comfort (Breast/Nipple): Soft / non-tender     Hold (Positioning): Assistance needed to correctly position infant at breast and maintain latch. Intervention(s):  Breastfeeding basics reviewed;Support Pillows;Position options;Skin to skin  LATCH Score: 7  Lactation Tools Discussed/Used WIC Program: No   Consult Status Consult Status: Follow-up Date: 01/02/15 Follow-up type: In-patient    Katrine Coho 01/01/2015, 2:51 PM

## 2015-01-01 NOTE — Anesthesia Preprocedure Evaluation (Signed)
Anesthesia Evaluation  Patient identified by MRN, date of birth, ID band Patient awake    Reviewed: Allergy & Precautions, NPO status , Patient's Chart, lab work & pertinent test results  History of Anesthesia Complications Negative for: history of anesthetic complications  Airway Mallampati: II  TM Distance: >3 FB Neck ROM: Full    Dental no notable dental hx. (+) Dental Advisory Given   Pulmonary former smoker,    Pulmonary exam normal breath sounds clear to auscultation       Cardiovascular negative cardio ROS Normal cardiovascular exam Rhythm:Regular Rate:Normal     Neuro/Psych  Headaches, negative psych ROS   GI/Hepatic negative GI ROS, Neg liver ROS,   Endo/Other  negative endocrine ROS  Renal/GU negative Renal ROS  negative genitourinary   Musculoskeletal negative musculoskeletal ROS (+)   Abdominal   Peds negative pediatric ROS (+)  Hematology negative hematology ROS (+)   Anesthesia Other Findings   Reproductive/Obstetrics negative OB ROS                             Anesthesia Physical Anesthesia Plan  ASA: II  Anesthesia Plan: Epidural   Post-op Pain Management:    Induction: Intravenous  Airway Management Planned:   Additional Equipment:   Intra-op Plan:   Post-operative Plan:   Informed Consent: I have reviewed the patients History and Physical, chart, labs and discussed the procedure including the risks, benefits and alternatives for the proposed anesthesia with the patient or authorized representative who has indicated his/her understanding and acceptance.   Dental advisory given  Plan Discussed with: CRNA  Anesthesia Plan Comments:         Anesthesia Quick Evaluation

## 2015-01-01 NOTE — Op Note (Signed)
Ann Sims, Ann Sims                 ACCOUNT NO.:  [id number]  MEDICAL RECORD NO.:  [id number]  LOCATION:  5916                          FACILITY:  [facility]  PHYSICIAN:  Darlyn Chamber, M.D.   DATE OF BIRTH:  1975/11/15  DATE OF PROCEDURE:  01/01/2015 DATE OF DISCHARGE:                              OPERATIVE REPORT   PREOPERATIVE DIAGNOSIS:  Multiparity, desires sterility.  POSTOPERATIVE DIAGNOSIS:  Multiparity, desires sterility.  PROCEDURE:  Postpartum bilateral tubal ligation.  SURGEON:  Darlyn Chamber, M.D.  ANESTHESIA:  Spinal.  BLOOD LOSS:  Minimal.  PACKS AND DRAINS:  Urethral Foley.  INTRAOPERATIVE BLOOD REPLACED:  None.  COMPLICATIONS:  None.  INDICATIONS:  The patient is a 39 year old, gravida 44, para 3 female who delivered yesterday.  She is desirous of permanent sterilization. Alternative forms of birth control were discussed.  Potential irreversibility of sterilization explained.  Failure rate of 1 in 200 is quoted.  Failures can be in the form of ectopic pregnancy, requiring further surgical intervention.  Risks of surgery explained including the risk of infection.  The risk of hemorrhage could require transfusion with the risk of AIDS or hepatitis.  Risk of injury to adjacent organs such as bowel that could require further exploratory surgery.  Risk of deep venous thrombosis and pulmonary embolus.  The patient does understand potential risks, complications, and alternatives of procedure.  DESCRIPTION OF PROCEDURE:  After satisfactory level of spinal anesthesia obtained, the abdomen was prepped out with Betadine and draped in sterile field.  Subumbilical incision made with knife extended through the subcutaneous tissue.  Fascia was identified, entered sharply, and incision in the fascia extended laterally.  Peritoneum was entered with blunt finger pressure.  We first identified the right fallopian tube. It was elevated through the incision out to its fimbriated  end.  The mid segment tube was isolated.  A hole was made in the avascular area of the mesosalpinx using the Bovie.  Individual ligatures of 0 plain catgut were used to ligate off the segment tube.  The segment was excised.  Cut ends of tubes were cauterized using the Bovie.  We had good hemostasis. We then identified the left tube by following up to its fimbriated end. A mid segment of tube was elevated through the incision.  A hole was made in avascular area of the mesosalpinx.  Individual ligatures were used to ligate off the segment tube.  The segment was then excised.  Cut ends of tubes were cauterized using the Bovie.  We had good hemostasis. We had no bleeding from either side.  Both ovaries felt normal.  At this point in time, fascia closed in running interlocking suture of 0 Vicryl. Some subcutaneous tissue was reclosed with 0 Vicryl. Skin was closed with interrupted subcuticular of 4-0 Vicryl.  Sponge, instrument, and needle count was correct by circulating nurse x2.  Foley catheter remained clear at the time of closure.  The patient tolerated the procedure well and returned to the recovery room in good condition.     Darlyn Chamber, M.D.     JSM/MEDQ  D:  01/01/2015  T:  01/01/2015  Job:  [id number]

## 2015-01-01 NOTE — Anesthesia Postprocedure Evaluation (Signed)
  Anesthesia Post-op Note  Patient: Ann Sims  Procedure(s) Performed: Procedure(s) (LRB): POST PARTUM TUBAL LIGATION (Bilateral)  Patient Location: PACU  Anesthesia Type: Spinal  Level of Consciousness: awake and alert   Airway and Oxygen Therapy: Patient Spontanous Breathing  Post-op Pain: mild  Post-op Assessment: Post-op Vital signs reviewed, Patient's Cardiovascular Status Stable, Respiratory Function Stable, Patent Airway and No signs of Nausea or vomiting  Last Vitals:  Filed Vitals:   01/01/15 1130  BP: 122/54  Pulse: 92  Temp: 36.3 C  Resp: 18    Post-op Vital Signs: stable   Complications: No apparent anesthesia complications

## 2015-01-03 ENCOUNTER — Encounter (HOSPITAL_COMMUNITY): Payer: Self-pay | Admitting: Obstetrics and Gynecology

## 2016-08-09 ENCOUNTER — Other Ambulatory Visit: Payer: Self-pay | Admitting: Family Medicine

## 2016-08-09 DIAGNOSIS — Z1231 Encounter for screening mammogram for malignant neoplasm of breast: Secondary | ICD-10-CM

## 2016-08-30 ENCOUNTER — Ambulatory Visit
Admission: RE | Admit: 2016-08-30 | Discharge: 2016-08-30 | Disposition: A | Discharge status: Home / Self Care | Payer: BLUE CROSS/BLUE SHIELD | Source: Ambulatory Visit | Attending: Family Medicine | Admitting: Family Medicine

## 2016-08-30 DIAGNOSIS — Z1231 Encounter for screening mammogram for malignant neoplasm of breast: Secondary | ICD-10-CM

## 2017-08-02 ENCOUNTER — Other Ambulatory Visit: Payer: Self-pay | Admitting: Obstetrics and Gynecology

## 2017-08-02 DIAGNOSIS — Z1231 Encounter for screening mammogram for malignant neoplasm of breast: Secondary | ICD-10-CM

## 2017-09-02 ENCOUNTER — Ambulatory Visit
Admission: RE | Admit: 2017-09-02 | Discharge: 2017-09-02 | Disposition: A | Discharge status: Home / Self Care | Payer: BLUE CROSS/BLUE SHIELD | Source: Ambulatory Visit | Attending: Obstetrics and Gynecology | Admitting: Obstetrics and Gynecology

## 2017-09-02 DIAGNOSIS — Z1231 Encounter for screening mammogram for malignant neoplasm of breast: Secondary | ICD-10-CM

## 2018-08-21 ENCOUNTER — Other Ambulatory Visit: Payer: Self-pay | Admitting: Obstetrics and Gynecology

## 2018-08-21 DIAGNOSIS — Z1231 Encounter for screening mammogram for malignant neoplasm of breast: Secondary | ICD-10-CM

## 2018-10-07 ENCOUNTER — Ambulatory Visit: Payer: BLUE CROSS/BLUE SHIELD

## 2018-11-12 ENCOUNTER — Other Ambulatory Visit: Payer: Self-pay

## 2018-11-12 ENCOUNTER — Ambulatory Visit
Admission: RE | Admit: 2018-11-12 | Discharge: 2018-11-12 | Disposition: A | Discharge status: Home / Self Care | Payer: BC Managed Care – PPO | Source: Ambulatory Visit | Attending: Obstetrics and Gynecology | Admitting: Obstetrics and Gynecology

## 2018-11-12 DIAGNOSIS — Z1231 Encounter for screening mammogram for malignant neoplasm of breast: Secondary | ICD-10-CM

## 2018-11-14 ENCOUNTER — Other Ambulatory Visit: Payer: Self-pay | Admitting: Obstetrics and Gynecology

## 2018-11-14 DIAGNOSIS — R928 Other abnormal and inconclusive findings on diagnostic imaging of breast: Secondary | ICD-10-CM

## 2018-11-20 ENCOUNTER — Other Ambulatory Visit: Payer: Self-pay | Admitting: Obstetrics and Gynecology

## 2018-11-20 ENCOUNTER — Ambulatory Visit
Admission: RE | Admit: 2018-11-20 | Discharge: 2018-11-20 | Disposition: A | Discharge status: Home / Self Care | Payer: BC Managed Care – PPO | Source: Ambulatory Visit | Attending: Obstetrics and Gynecology | Admitting: Obstetrics and Gynecology

## 2018-11-20 ENCOUNTER — Other Ambulatory Visit: Payer: Self-pay

## 2018-11-20 DIAGNOSIS — R928 Other abnormal and inconclusive findings on diagnostic imaging of breast: Secondary | ICD-10-CM

## 2018-11-20 DIAGNOSIS — N6489 Other specified disorders of breast: Secondary | ICD-10-CM

## 2018-11-25 ENCOUNTER — Ambulatory Visit
Admission: RE | Admit: 2018-11-25 | Discharge: 2018-11-25 | Disposition: A | Discharge status: Home / Self Care | Payer: BC Managed Care – PPO | Source: Ambulatory Visit | Attending: Obstetrics and Gynecology | Admitting: Obstetrics and Gynecology

## 2018-11-25 ENCOUNTER — Other Ambulatory Visit: Payer: Self-pay

## 2018-11-25 DIAGNOSIS — N6489 Other specified disorders of breast: Secondary | ICD-10-CM

## 2018-11-25 HISTORY — PX: BREAST BIOPSY: SHX20

## 2019-10-09 ENCOUNTER — Other Ambulatory Visit: Payer: Self-pay | Admitting: Obstetrics and Gynecology

## 2019-10-09 DIAGNOSIS — Z1231 Encounter for screening mammogram for malignant neoplasm of breast: Secondary | ICD-10-CM

## 2019-11-19 ENCOUNTER — Ambulatory Visit: Payer: BC Managed Care – PPO

## 2019-12-07 ENCOUNTER — Other Ambulatory Visit: Payer: Self-pay

## 2019-12-07 ENCOUNTER — Ambulatory Visit
Admission: RE | Admit: 2019-12-07 | Discharge: 2019-12-07 | Disposition: A | Discharge status: Home / Self Care | Payer: BC Managed Care – PPO | Source: Ambulatory Visit | Attending: Obstetrics and Gynecology | Admitting: Obstetrics and Gynecology

## 2019-12-07 DIAGNOSIS — Z1231 Encounter for screening mammogram for malignant neoplasm of breast: Secondary | ICD-10-CM

## 2020-11-08 ENCOUNTER — Other Ambulatory Visit: Payer: Self-pay | Admitting: Obstetrics and Gynecology

## 2020-11-08 DIAGNOSIS — Z1231 Encounter for screening mammogram for malignant neoplasm of breast: Secondary | ICD-10-CM

## 2020-12-13 ENCOUNTER — Ambulatory Visit
Admission: RE | Admit: 2020-12-13 | Discharge: 2020-12-13 | Disposition: A | Discharge status: Home / Self Care | Payer: BC Managed Care – PPO | Source: Ambulatory Visit | Attending: Obstetrics and Gynecology | Admitting: Obstetrics and Gynecology

## 2020-12-13 ENCOUNTER — Other Ambulatory Visit: Payer: Self-pay

## 2020-12-13 DIAGNOSIS — Z1231 Encounter for screening mammogram for malignant neoplasm of breast: Secondary | ICD-10-CM

## 2020-12-21 ENCOUNTER — Other Ambulatory Visit: Payer: Self-pay | Admitting: Obstetrics and Gynecology

## 2020-12-21 DIAGNOSIS — R928 Other abnormal and inconclusive findings on diagnostic imaging of breast: Secondary | ICD-10-CM

## 2021-01-10 ENCOUNTER — Ambulatory Visit
Admission: RE | Admit: 2021-01-10 | Discharge: 2021-01-10 | Disposition: A | Discharge status: Home / Self Care | Payer: BC Managed Care – PPO | Source: Ambulatory Visit | Attending: Obstetrics and Gynecology | Admitting: Obstetrics and Gynecology

## 2021-01-10 ENCOUNTER — Other Ambulatory Visit: Payer: Self-pay

## 2021-01-10 DIAGNOSIS — R928 Other abnormal and inconclusive findings on diagnostic imaging of breast: Secondary | ICD-10-CM

## 2021-11-09 IMAGING — MG MM DIGITAL DIAGNOSTIC UNILAT*L* W/ TOMO W/ CAD
4 series · 4 of 12 positions shown · non-contrast
Comparison: Previous exams including recent screening mammogram
dated 12/13/2020.

CLINICAL DATA: Patient returns today to evaluate a LEFT breast mass
identified on recent screening mammogram. History of benign RIGHT
breast stereotactic biopsy in 9898.

EXAM:
DIGITAL DIAGNOSTIC UNILATERAL LEFT MAMMOGRAM WITH TOMOSYNTHESIS AND
CAD; ULTRASOUND LEFT BREAST LIMITED
TECHNIQUE: Left digital diagnostic mammography and breast tomosynthesis was
performed. The images were evaluated with computer-aided detection.;
Targeted ultrasound examination of the left breast was performed.

[L CC synth-2D]
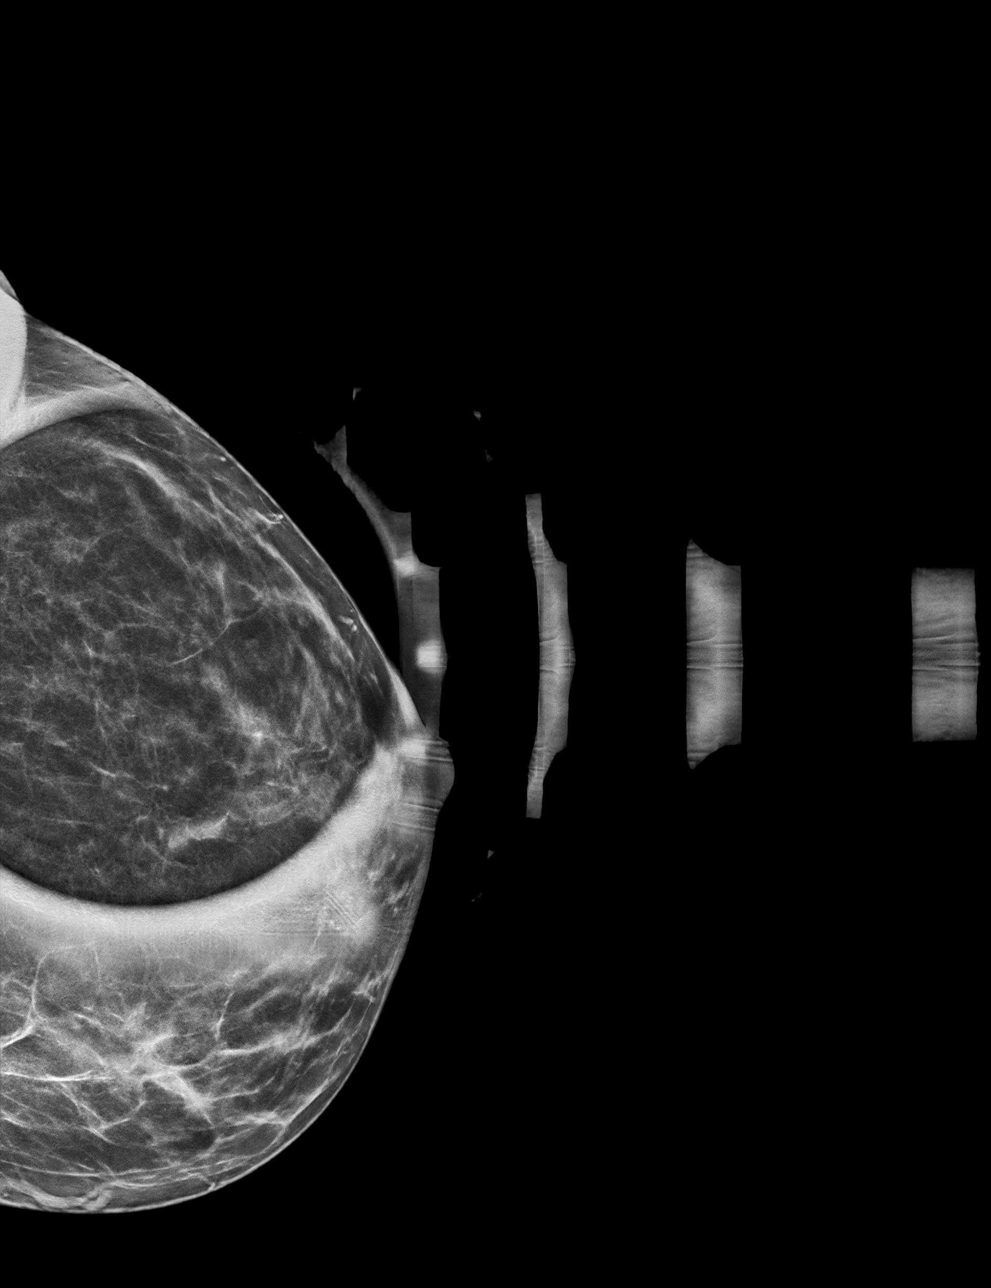

[L MLO synth-2D]
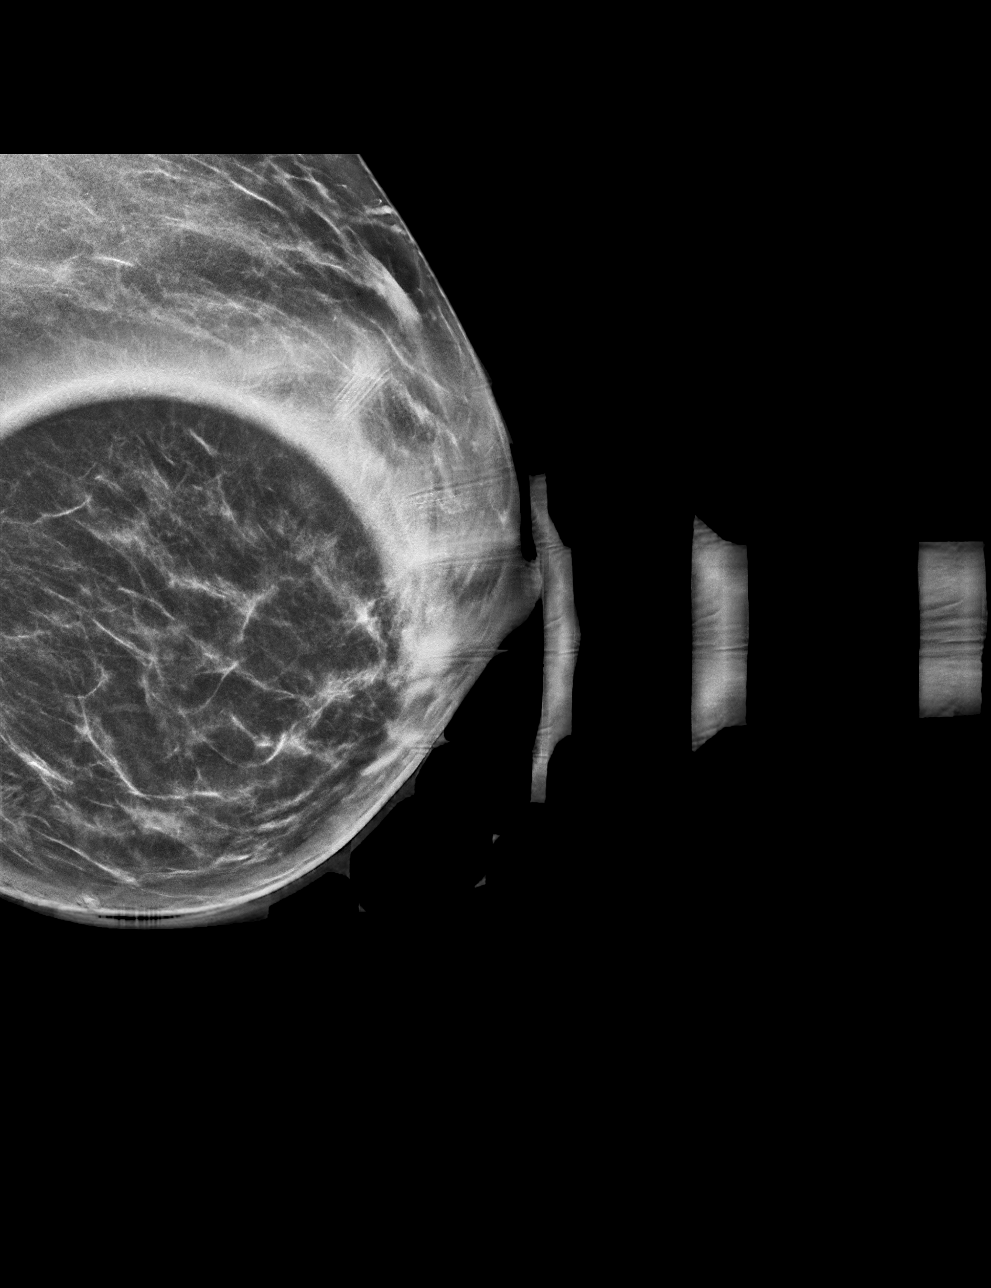

[L CC tomo · tomo slice 26/51.0]
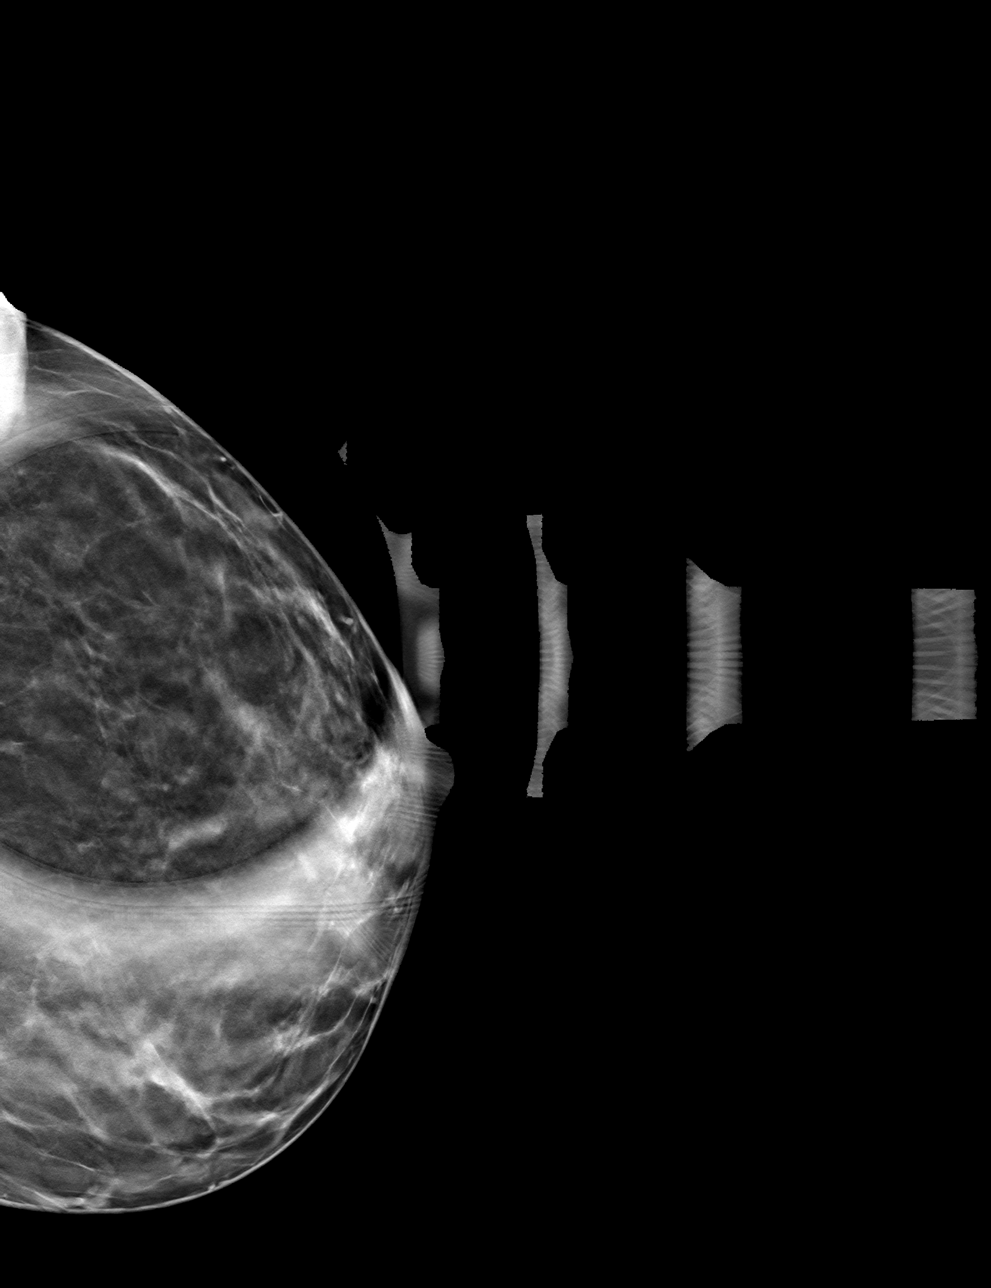

[L MLO tomo · tomo slice 28/55.0]
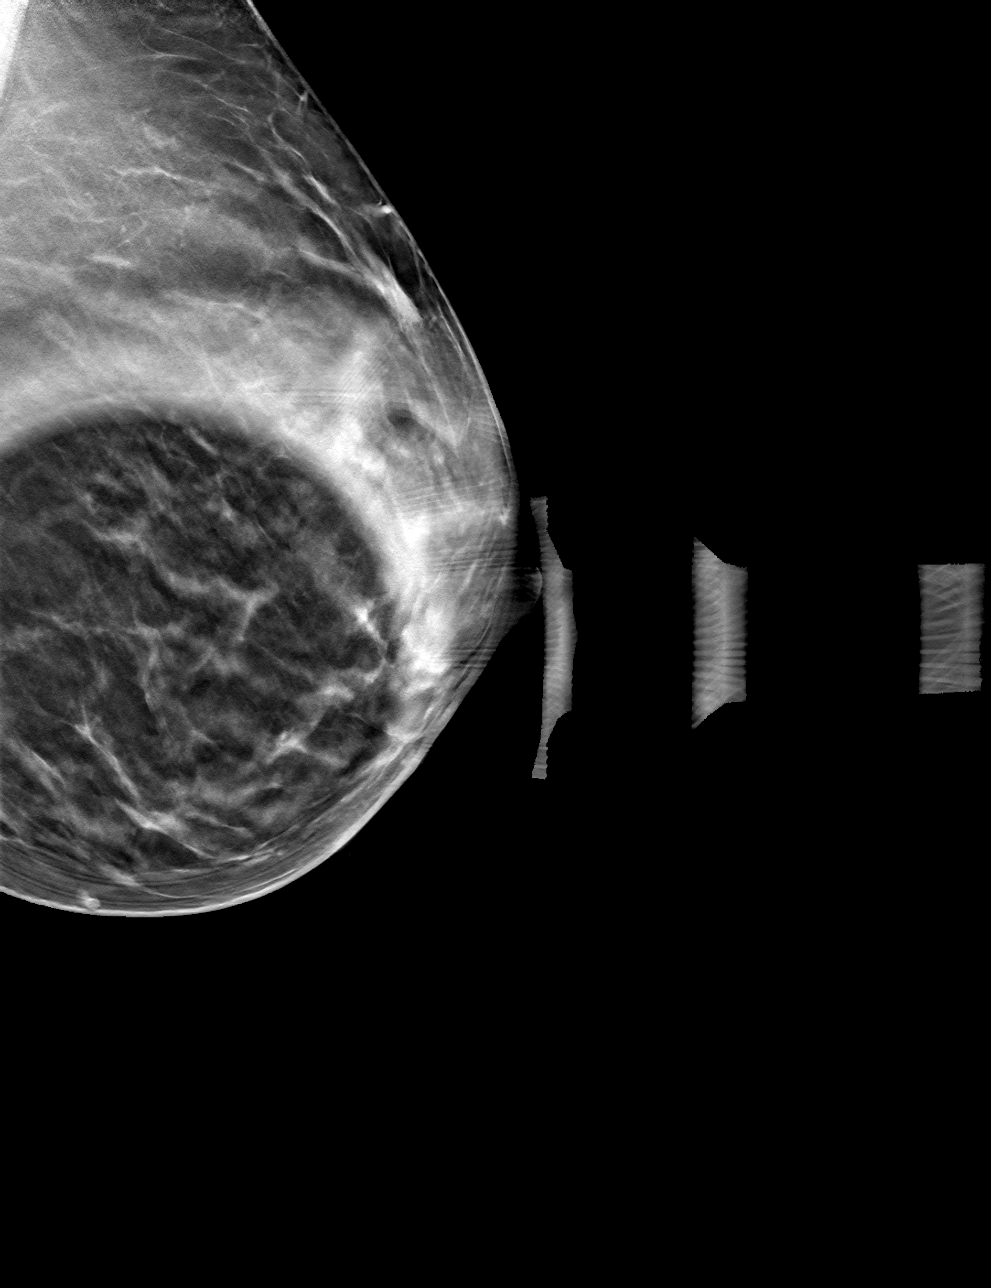

[4 of 12 positions shown; findings below may reference images not displayed]

ACR Breast Density Category c: The breast tissue is heterogeneously
dense, which may obscure small masses.
FINDINGS: On today's additional diagnostic views with spot compression and 3D
tomosynthesis, a small partially obscured mass is confirmed within
the outer LEFT breast.

Targeted ultrasound is performed, showing a benign cyst in the LEFT
breast at the 5 o'clock axis, 4 cm from the nipple, measuring 4 mm,
corresponding to the mammographic finding.
IMPRESSION: No evidence of malignancy. Benign cyst in the LEFT breast at the 5
o'clock axis, measuring 4 mm, corresponding to the mammographic
finding.

Patient may return to routine annual bilateral screening mammogram
schedule.

RECOMMENDATION:
Screening mammogram in one year.(Code:24-I-TOR)

I have discussed the findings and recommendations with the patient.
If applicable, a reminder letter will be sent to the patient
regarding the next appointment.

BI-RADS CATEGORY  2: Benign.

## 2021-11-09 IMAGING — US US BREAST*L* LIMITED INC AXILLA
1 series · 6 of 6 positions shown · non-contrast
Comparison: Previous exams including recent screening mammogram
dated 12/13/2020.

CLINICAL DATA: Patient returns today to evaluate a LEFT breast mass
identified on recent screening mammogram. History of benign RIGHT
breast stereotactic biopsy in 9898.

EXAM:
DIGITAL DIAGNOSTIC UNILATERAL LEFT MAMMOGRAM WITH TOMOSYNTHESIS AND
CAD; ULTRASOUND LEFT BREAST LIMITED
TECHNIQUE: Left digital diagnostic mammography and breast tomosynthesis was
performed. The images were evaluated with computer-aided detection.;
Targeted ultrasound examination of the left breast was performed.

[Series 1: us breast*left* limited inc axilla · 0.05mm/px · 6 of 6 slices shown]
[im 1/6]
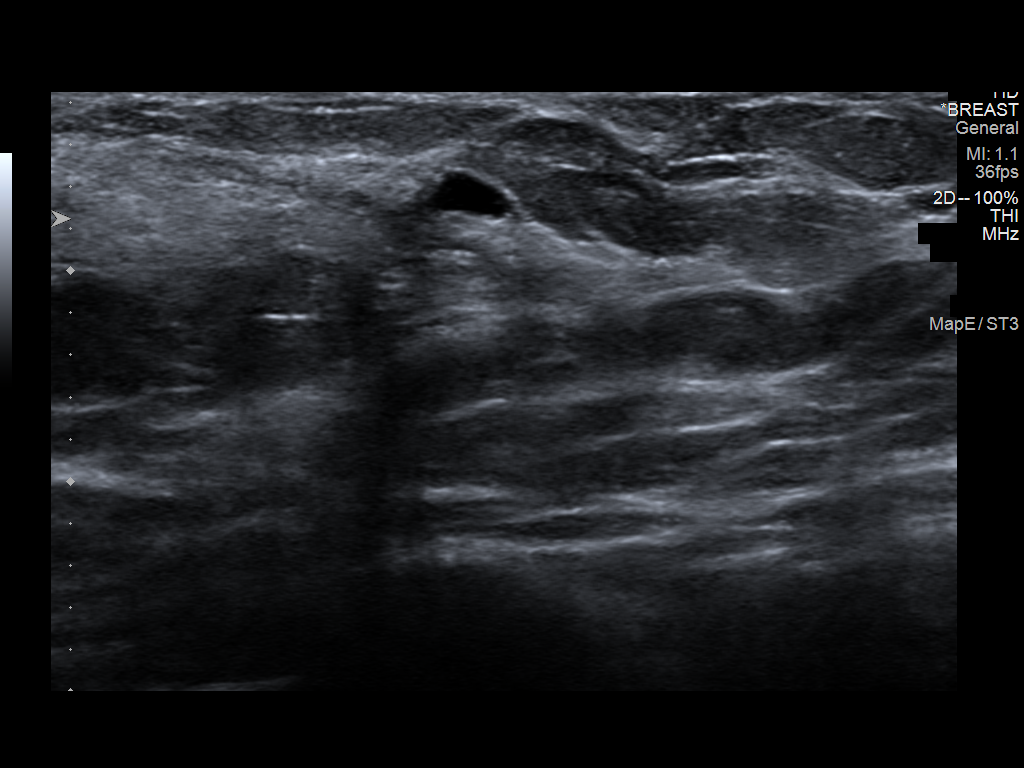
[im 2/6]
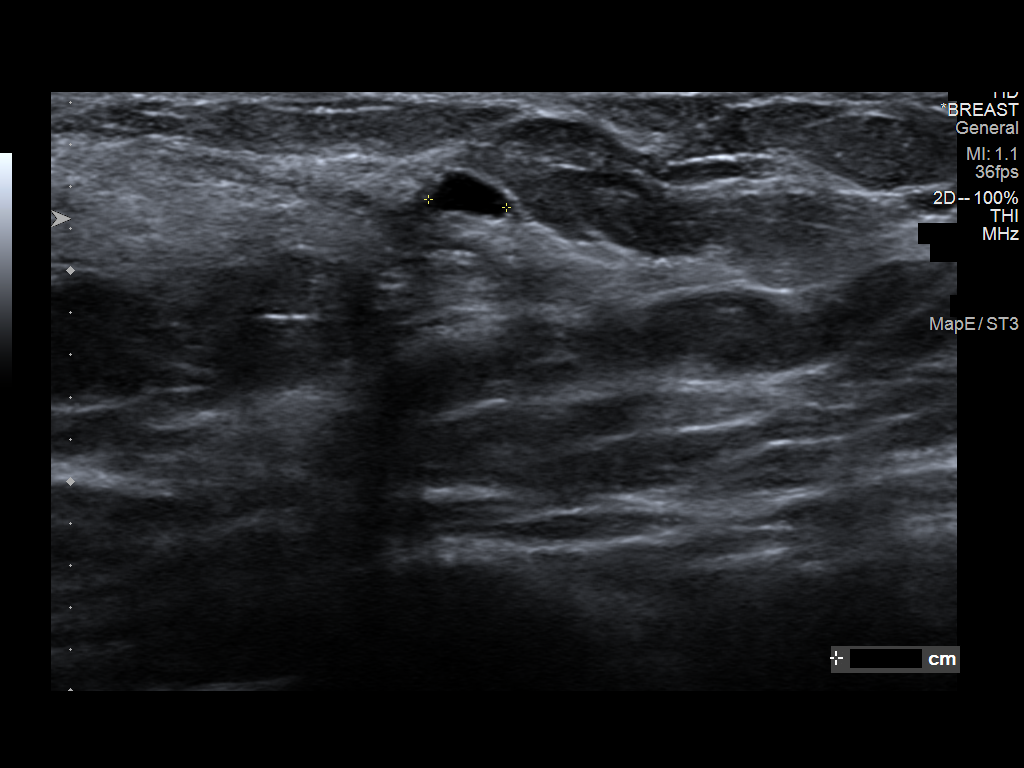
[im 3/6]
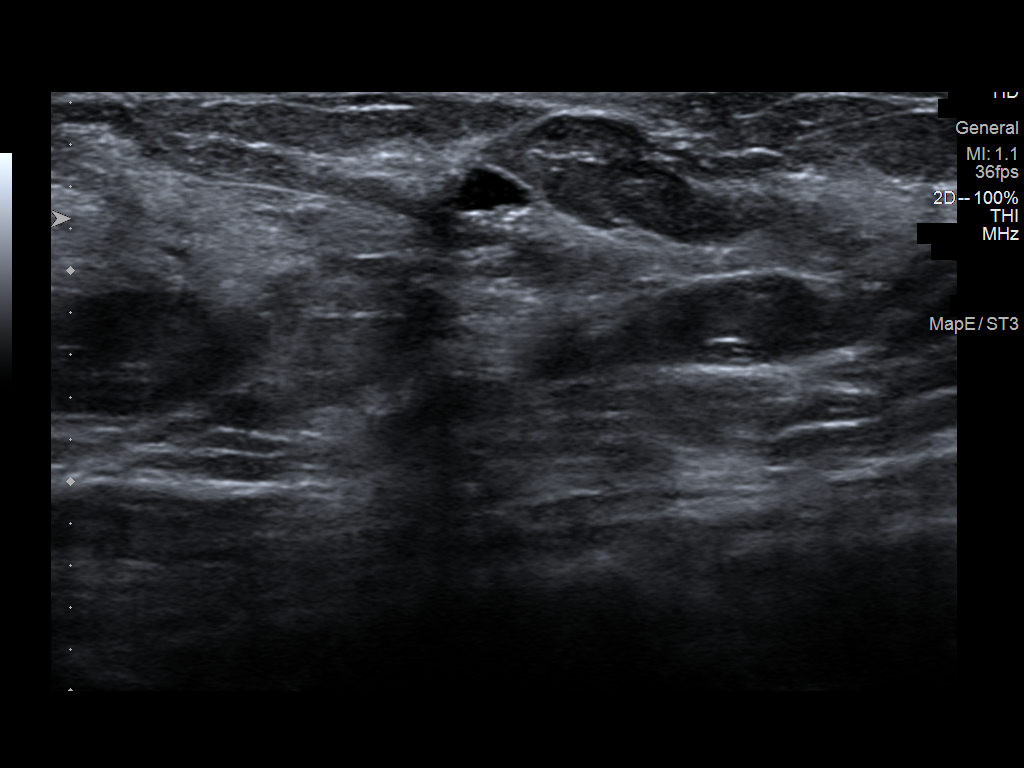
[im 4/6]
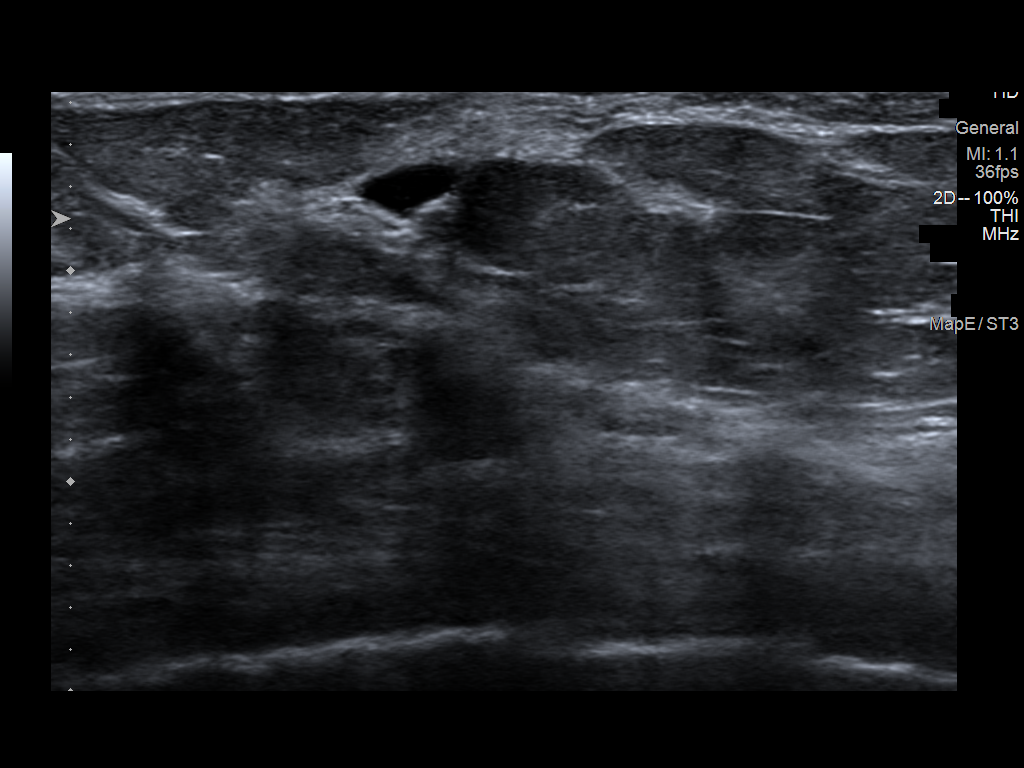
[im 5/6]
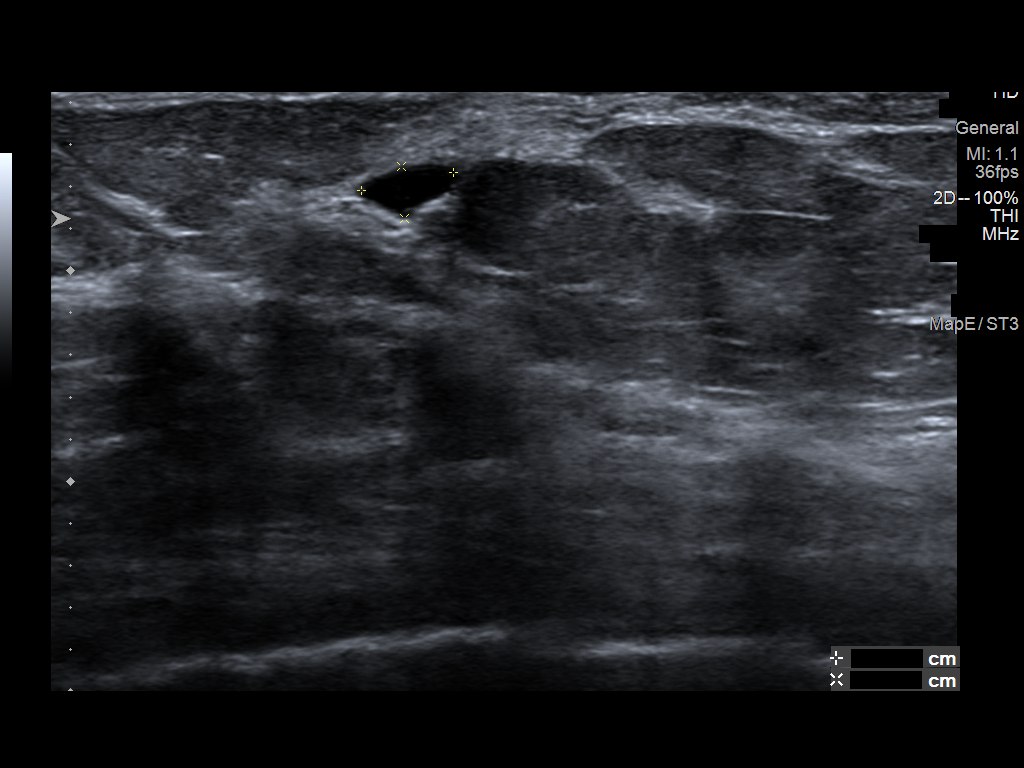
[im 6/6]
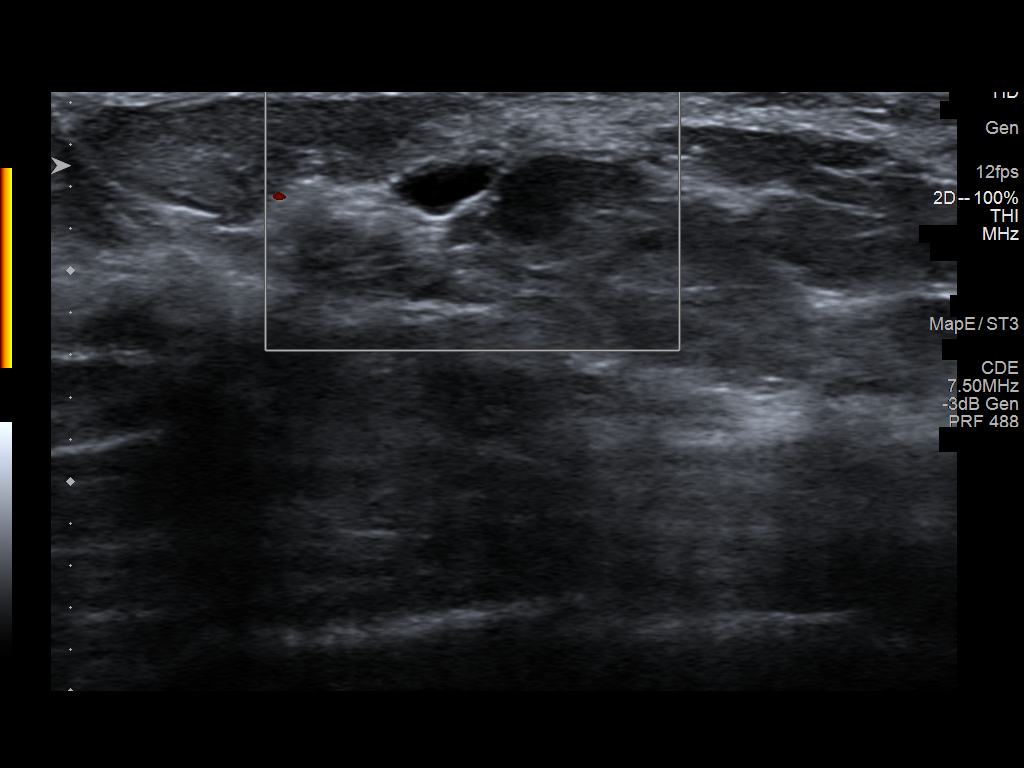

[6 of 6 positions shown; findings below may reference images not displayed]

ACR Breast Density Category c: The breast tissue is heterogeneously
dense, which may obscure small masses.
FINDINGS: On today's additional diagnostic views with spot compression and 3D
tomosynthesis, a small partially obscured mass is confirmed within
the outer LEFT breast.

Targeted ultrasound is performed, showing a benign cyst in the LEFT
breast at the 5 o'clock axis, 4 cm from the nipple, measuring 4 mm,
corresponding to the mammographic finding.
IMPRESSION: No evidence of malignancy. Benign cyst in the LEFT breast at the 5
o'clock axis, measuring 4 mm, corresponding to the mammographic
finding.

Patient may return to routine annual bilateral screening mammogram
schedule.

RECOMMENDATION:
Screening mammogram in one year.(Code:24-I-TOR)

I have discussed the findings and recommendations with the patient.
If applicable, a reminder letter will be sent to the patient
regarding the next appointment.

BI-RADS CATEGORY  2: Benign.

## 2021-11-14 ENCOUNTER — Other Ambulatory Visit: Payer: Self-pay | Admitting: Obstetrics and Gynecology

## 2021-11-14 DIAGNOSIS — Z1231 Encounter for screening mammogram for malignant neoplasm of breast: Secondary | ICD-10-CM

## 2021-12-14 ENCOUNTER — Ambulatory Visit
Admission: RE | Admit: 2021-12-14 | Discharge: 2021-12-14 | Disposition: A | Discharge status: Home / Self Care | Payer: BC Managed Care – PPO | Source: Ambulatory Visit | Attending: Obstetrics and Gynecology | Admitting: Obstetrics and Gynecology

## 2021-12-14 DIAGNOSIS — Z1231 Encounter for screening mammogram for malignant neoplasm of breast: Secondary | ICD-10-CM

## 2022-11-07 ENCOUNTER — Other Ambulatory Visit: Payer: Self-pay | Admitting: Obstetrics & Gynecology

## 2022-11-07 DIAGNOSIS — Z1231 Encounter for screening mammogram for malignant neoplasm of breast: Secondary | ICD-10-CM

## 2022-12-17 ENCOUNTER — Ambulatory Visit
Admission: RE | Admit: 2022-12-17 | Discharge: 2022-12-17 | Disposition: A | Discharge status: Home / Self Care | Payer: BC Managed Care – PPO | Source: Ambulatory Visit | Attending: Obstetrics & Gynecology | Admitting: Obstetrics & Gynecology

## 2022-12-17 DIAGNOSIS — Z1231 Encounter for screening mammogram for malignant neoplasm of breast: Secondary | ICD-10-CM

## 2023-12-25 ENCOUNTER — Other Ambulatory Visit: Payer: Self-pay | Admitting: Obstetrics & Gynecology

## 2023-12-25 DIAGNOSIS — Z1231 Encounter for screening mammogram for malignant neoplasm of breast: Secondary | ICD-10-CM

## 2024-01-14 ENCOUNTER — Ambulatory Visit
Admission: RE | Admit: 2024-01-14 | Discharge: 2024-01-14 | Disposition: A | Discharge status: Home / Self Care | Source: Ambulatory Visit | Attending: Obstetrics & Gynecology | Admitting: Obstetrics & Gynecology

## 2024-01-14 DIAGNOSIS — Z1231 Encounter for screening mammogram for malignant neoplasm of breast: Secondary | ICD-10-CM
